# Patient Record
Sex: Female | Born: 1960 | Race: White | Hispanic: No | Marital: Single | Smoking: Current some day smoker
Health system: Southern US, Community
[De-identification: ages and names within clinical notes are randomized; demographics above are authoritative.]

## PROBLEM LIST (undated history)

## (undated) DIAGNOSIS — Z9189 Other specified personal risk factors, not elsewhere classified: Secondary | ICD-10-CM

## (undated) DIAGNOSIS — IMO0001 Reserved for inherently not codable concepts without codable children: Secondary | ICD-10-CM

## (undated) DIAGNOSIS — G43909 Migraine, unspecified, not intractable, without status migrainosus: Secondary | ICD-10-CM

## (undated) DIAGNOSIS — K219 Gastro-esophageal reflux disease without esophagitis: Secondary | ICD-10-CM

---

## 2001-02-21 HISTORY — PX: BACK SURGERY: SHX140

## 2008-07-24 ENCOUNTER — Ambulatory Visit: Payer: Self-pay | Admitting: Family Medicine

## 2008-07-24 DIAGNOSIS — I10 Essential (primary) hypertension: Secondary | ICD-10-CM

## 2008-07-24 DIAGNOSIS — E782 Mixed hyperlipidemia: Secondary | ICD-10-CM

## 2008-07-24 DIAGNOSIS — F39 Unspecified mood [affective] disorder: Secondary | ICD-10-CM | POA: Insufficient documentation

## 2008-08-19 ENCOUNTER — Telehealth: Payer: Self-pay | Admitting: Family Medicine

## 2008-12-25 ENCOUNTER — Ambulatory Visit: Payer: Self-pay | Admitting: Family Medicine

## 2008-12-25 DIAGNOSIS — R062 Wheezing: Secondary | ICD-10-CM

## 2009-01-22 ENCOUNTER — Ambulatory Visit: Payer: Self-pay | Admitting: Family Medicine

## 2009-01-22 DIAGNOSIS — R21 Rash and other nonspecific skin eruption: Secondary | ICD-10-CM

## 2009-02-12 ENCOUNTER — Ambulatory Visit: Payer: Self-pay | Admitting: Family Medicine

## 2009-02-12 DIAGNOSIS — J42 Unspecified chronic bronchitis: Secondary | ICD-10-CM

## 2009-02-23 ENCOUNTER — Ambulatory Visit: Payer: Self-pay | Admitting: Family Medicine

## 2009-02-24 ENCOUNTER — Telehealth: Payer: Self-pay | Admitting: Family Medicine

## 2009-03-25 ENCOUNTER — Ambulatory Visit: Payer: Self-pay | Admitting: Family Medicine

## 2009-03-25 LAB — CONVERTED CEMR LAB
Inflenza A Ag: NEGATIVE
Influenza B Ag: NEGATIVE

## 2009-04-03 ENCOUNTER — Telehealth: Payer: Self-pay | Admitting: Family Medicine

## 2009-06-23 ENCOUNTER — Encounter: Admission: RE | Admit: 2009-06-23 | Discharge: 2009-06-23 | Payer: Self-pay | Admitting: Family Medicine

## 2009-06-23 ENCOUNTER — Ambulatory Visit: Payer: Self-pay | Admitting: Family Medicine

## 2009-06-23 DIAGNOSIS — M545 Low back pain: Secondary | ICD-10-CM

## 2009-07-06 ENCOUNTER — Ambulatory Visit: Payer: Self-pay | Admitting: Family Medicine

## 2009-09-07 ENCOUNTER — Ambulatory Visit: Payer: Self-pay | Admitting: Family Medicine

## 2009-09-07 DIAGNOSIS — K3189 Other diseases of stomach and duodenum: Secondary | ICD-10-CM | POA: Insufficient documentation

## 2009-09-07 DIAGNOSIS — R1013 Epigastric pain: Secondary | ICD-10-CM

## 2009-09-17 ENCOUNTER — Encounter: Payer: Self-pay | Admitting: Family Medicine

## 2009-09-19 ENCOUNTER — Encounter: Payer: Self-pay | Admitting: Family Medicine

## 2009-09-28 ENCOUNTER — Encounter: Payer: Self-pay | Admitting: Family Medicine

## 2009-10-15 ENCOUNTER — Encounter: Payer: Self-pay | Admitting: Family Medicine

## 2009-10-16 ENCOUNTER — Encounter: Payer: Self-pay | Admitting: Family Medicine

## 2009-10-21 ENCOUNTER — Encounter: Payer: Self-pay | Admitting: Family Medicine

## 2009-10-22 LAB — CONVERTED CEMR LAB
Cholesterol: 250 mg/dL — ABNORMAL HIGH (ref 0–200)
HDL: 35 mg/dL — ABNORMAL LOW (ref >39)
Total CHOL/HDL Ratio: 7.1
VLDL: 63 mg/dL — ABNORMAL HIGH (ref 0–40)

## 2009-10-23 ENCOUNTER — Telehealth: Payer: Self-pay | Admitting: Family Medicine

## 2009-10-31 ENCOUNTER — Ambulatory Visit: Payer: Self-pay | Admitting: Emergency Medicine

## 2009-10-31 LAB — CONVERTED CEMR LAB
Bilirubin Urine: NEGATIVE
Ketones, urine, test strip: NEGATIVE

## 2009-11-01 ENCOUNTER — Encounter: Payer: Self-pay | Admitting: Family Medicine

## 2009-11-06 ENCOUNTER — Ambulatory Visit: Payer: Self-pay | Admitting: Family Medicine

## 2009-11-06 DIAGNOSIS — N760 Acute vaginitis: Secondary | ICD-10-CM | POA: Insufficient documentation

## 2009-11-06 LAB — CONVERTED CEMR LAB
Glucose, Urine, Semiquant: NEGATIVE
Nitrite: NEGATIVE
Urobilinogen, UA: 0.2

## 2009-11-10 ENCOUNTER — Encounter: Payer: Self-pay | Admitting: Family Medicine

## 2009-11-11 LAB — CONVERTED CEMR LAB: Yeast Wet Prep HPF POC: NONE SEEN

## 2009-11-26 ENCOUNTER — Telehealth: Payer: Self-pay | Admitting: Family Medicine

## 2010-01-19 ENCOUNTER — Encounter: Payer: Self-pay | Admitting: Family Medicine

## 2010-03-23 NOTE — Consult Note (Signed)
Summary: [facility]  [facility]   Imported By: Lanelle Bal 09/25/2009 11:53:16  _____________________________________________________________________  External Attachment:    Type:   Image     Comment:   External Document

## 2010-03-23 NOTE — Letter (Signed)
Summary: Letter to Patient with [place] Results/[facility]  Letter to Patient with [place] Results/[facility]   Imported By: Lanelle Bal 10/06/2009 11:49:44  _____________________________________________________________________  External Attachment:    Type:   Image     Comment:   External Document

## 2010-03-23 NOTE — Progress Notes (Signed)
Summary: Wants Chantix  Phone Note Call from Patient Call back at Home Phone [phone]   Caller: Patient Call For: Nani Gasser MD Summary of Call: Pt would like to start the Chantix to stop smoking. Please send to [facility] Initial call taken by: Kathlene November,  February 24, 2009 8:14 AM  Follow-up for Phone Call        Rx sent. Call if any mood change. Call in one month if doing well and will call  in the continuing pack.  Follow-up by: Nani Gasser MD,  February 24, 2009 8:34 AM    New/Updated Medications: CHANTIX STARTING MONTH PAK 0.5 MG X 11 & 1 MG X 42 TABS (VARENICLINE TARTRATE) Take as directed. Prescriptions: CHANTIX STARTING MONTH PAK 0.5 MG X 11 & 1 MG X 42 TABS (VARENICLINE TARTRATE) Take as directed.  #1 pack x 0   Entered and Authorized by:   Nani Gasser MD   Signed by:   Nani Gasser MD on 02/24/2009   Method used:   Electronically to        [facility] (retail)       [address]       [place], [place]  [address]       Ph: [phone]       Fax: [phone]   RxID:   [id number]

## 2010-03-23 NOTE — Progress Notes (Signed)
Summary: Antibiotics refill  Phone Note Call from Patient   Caller: Patient Summary of Call: Patient called and left a voice mail wanting  to know if her Dr. could refill her antibiotics for her? She asked that someone call her back at phone number [phone]... Thanks, Victorino Dike Initial call taken by: Michaelle Copas,  November 26, 2009 9:11 AM  Follow-up for Phone Call        called pt at number listed and left message stating that we dont refill abx without eval you in the office first and to call back with questions or to make an appt Follow-up by: Avon Gully CMA, Duncan Dull),  November 26, 2009 9:51 AM

## 2010-03-23 NOTE — Letter (Signed)
Summary: Out of Work  [facility]  [address], [address]   [place], [place] [address]   Phone: [phone]  Fax: [phone]    Jun 23, 2009   Employee:  Vickie Carter    To Whom It May Concern:   For Medical reasons, please excuse the above named employee from work for the following dates:  Can return to work on 06/25/2009 with no lifting, pushing or pulling greater than 10 pounds. 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 She will follow up for re-evaluation at that point.   If you need additional information, please feel free to contact our office.         Sincerely,    Nani Gasser MD

## 2010-03-23 NOTE — Letter (Signed)
Summary: [facility]  [facility]   Imported By: Maryln Gottron 01/28/2010 15:46:12  _____________________________________________________________________  External Attachment:    Type:   Image     Comment:   External Document

## 2010-03-23 NOTE — Letter (Signed)
Summary: Letter to Patient with Lab Results/[facility]   Letter to Patient with Lab Results/[facility]   Imported By: Lanelle Bal 09/28/2009 13:50:40  _____________________________________________________________________  External Attachment:    Type:   Image     Comment:   External Document

## 2010-03-23 NOTE — Letter (Signed)
Summary: Letter with Colonoscopy Results/[facility]  Letter with Colonoscopy Results/[facility]   Imported By: Lanelle Bal 10/28/2009 10:39:33  _____________________________________________________________________  External Attachment:    Type:   Image     Comment:   External Document

## 2010-03-23 NOTE — Assessment & Plan Note (Signed)
Summary: Burning, painful urination x last night rm 4   Vital Signs:  Patient Profile:   50 Years Old Female CC:      Burning, painful urination x 1 dy Height:     65 inches Weight:      194 pounds O2 Sat:      100 % O2 treatment:    Room Air Temp:     98.0 degrees F oral Pulse rate:   67 / minute Pulse rhythm:   regular Resp:     16 per minute BP sitting:   122 / 80  (left arm) Cuff size:   regular  Vitals Entered By: Areta Haber CMA (October 31, 2009 11:47 AM)                  Current Allergies: No known allergies History of Present Illness History from: patient Chief Complaint: Burning, painful urination x 1 dy History of Present Illness: This 50 Years Old White Female comes in today complaining of urinary symptoms starting yesterday.  +dysuria, frequency, urgency.  No F/C/N/V, hematuria, vag d/c.  Azo helps. Worse with urination.  Current Problems: URINARY TRACT INFECTION (ICD-599.0) DYSPEPSIA (ICD-536.8) BACK PAIN, LUMBAR (ICD-724.2) ACUTE BRONCHITIS (ICD-466.0) SKIN RASH (ICD-782.1) WHEEZING (ICD-786.07) HYPERLIPIDEMIA (ICD-272.4) HYPERTENSION, BENIGN (ICD-401.1) MOOD DISORDER (ICD-296.90)   Current Meds ALPRAZOLAM 0.5 MG TABS (ALPRAZOLAM) Take one tablet by mouth three times a day as needed anxiety PROPRANOLOL HCL 40 MG TABS (PROPRANOLOL HCL) Take 1 tablet by mouth two times a day POLYETHYLENE GLYCOL 3350  POWD (POLYETHYLENE GLYCOL 3350) 1 capful mixed with a glass of water once a day PROZAC 20 MG CAPS (FLUOXETINE HCL) Take 1 tablet by mouth once a day VENTOLIN HFA 108 (90 BASE) MCG/ACT AERS (ALBUTEROL SULFATE) 2- 4 puff inhaled two times a day as needed SYMBICORT 160-4.5 MCG/ACT AERO (BUDESONIDE-FORMOTEROL FUMARATE) 2 puffs inhaled two times a day TRAZODONE HCL 50 MG TABS (TRAZODONE HCL) take 1-2 tablets by mouth at bedtime for sleep OMEPRAZOLE 40 MG CPDR (OMEPRAZOLE) 1 tab by mouth qAM, take 30 min before breakfast CYCLOBENZAPRINE HCL 10 MG TABS  (CYCLOBENZAPRINE HCL) 1 tab by mouth hs AZO-STANDARD 95 MG TABS (PHENAZOPYRIDINE HCL) as directed BACTRIM DS 800-160 MG TABS (SULFAMETHOXAZOLE-TRIMETHOPRIM) 1 tab by mouth two times a day for 5 days  REVIEW OF SYSTEMS Constitutional Symptoms      Denies fever, chills, night sweats, weight loss, weight gain, and fatigue.  Eyes       Denies change in vision, eye pain, eye discharge, glasses, contact lenses, and eye surgery. Ear/Nose/Throat/Mouth       Denies hearing loss/aids, change in hearing, ear pain, ear discharge, dizziness, frequent runny nose, frequent nose bleeds, sinus problems, sore throat, hoarseness, and tooth pain or bleeding.  Respiratory       Denies dry cough, productive cough, wheezing, shortness of breath, asthma, bronchitis, and emphysema/COPD.  Cardiovascular       Denies murmurs, chest pain, and tires easily with exhertion.    Gastrointestinal       Denies stomach pain, nausea/vomiting, diarrhea, constipation, blood in bowel movements, and indigestion. Genitourniary       Complains of painful urination.      Denies kidney stones and loss of urinary control.      Comments: Burning x last night Neurological       Denies paralysis, seizures, and fainting/blackouts. Musculoskeletal       Denies muscle pain, joint pain, joint stiffness, decreased range of motion, redness, swelling, muscle weakness, and  gout.  Skin       Denies bruising, unusual mles/lumps or sores, and hair/skin or nail changes.  Psych       Denies mood changes, temper/anger issues, anxiety/stress, speech problems, depression, and sleep problems.  Past History:  Past Medical History: Last updated: 12/25/2008 Seing Dr.Pugh.    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   Current Smoker Alcohol use-no Drug  use-no Regular exercise-no  Risk Factors: Exercise: no (07/24/2008)  Risk Factors: Smoking Status: current (07/24/2008) Physical Exam General appearance: well developed, well nourished, no acute distress Chest/Lungs: no rales, wheezes, or rhonchi bilateral, breath sounds equal without effort Heart: regular rate and  rhythm, no murmur Back: no CVAT Skin: no obvious rashes or lesions MSE: oriented to time, place, and person Assessment New Problems: URINARY TRACT INFECTION (ICD-599.0)   Plan New Medications/Changes: BACTRIM DS 800-160 MG TABS (SULFAMETHOXAZOLE-TRIMETHOPRIM) 1 tab by mouth two times a day for 5 days  #10 x 0, 10/31/2009, Hoyt Koch MD  New Orders: New Patient Level II [phone] UA Dipstick w/o Micro (automated)  [81003] T-Culture, Urine [[id number]] Planning Comments:   Hydration May continue the AZO for another 1-2 days Wipe front to back If recurrent symptoms or worsening problems, please Follow-up with your primary care physician or a urologist   The patient and/or caregiver has been counseled thoroughly with regard to medications prescribed including dosage, schedule, interactions, rationale for use, and possible side effects and they verbalize understanding.  Diagnoses and expected course of recovery discussed and will return if not improved as expected or if the condition worsens. Patient and/or caregiver verbalized understanding.  Prescriptions: BACTRIM DS 800-160 MG TABS (SULFAMETHOXAZOLE-TRIMETHOPRIM) 1 tab by mouth two times a day for 5 days  #10 x 0   Entered and Authorized by:   Hoyt Koch MD   Signed by:   Hoyt Koch MD on 10/31/2009   Method used:   Printed then faxed to ...       [facility]* (retail)       [address]       [place], [place]  [address]       Ph: [phone]       Fax: [phone]   RxID:   [id number]   Orders Added: 1)  New Patient Level II [99202] 2)  UA Dipstick w/o Micro (automated)  [81003] 3)   T-Culture, Urine [[id number]]  Laboratory Results   Urine Tests  Date/Time Received: October 31, 2009 12:00 PM  Date/Time Reported: October 31, 2009 12:00 PM   Routine Urinalysis   Color: orange Appearance: Clear Glucose: 100   (Normal Range: Negative) Bilirubin: negative   (Normal Range: Negative) Ketone: negative   (Normal Range: Negative) Spec. Gravity: 1.020   (Normal Range: 1.003-1.035) Blood: trace-intact   (Normal Range: Negative) pH: 5.0   (Normal Range: 5.0-8.0) Protein: trace   (Normal Range: Negative) Urobilinogen: 1.0   (Normal Range: 0-1) Nitrite: positive   (Normal Range: Negative) Leukocyte Esterace: negative   (Normal Range: Negative)

## 2010-03-23 NOTE — Assessment & Plan Note (Signed)
Summary: LBP f/u/ hiatal hernia   Vital Signs:  Patient profile:   50 year old female Height:      65 inches Weight:      197 pounds BMI:     32.90 O2 Sat:      96 % on Room air Pulse rate:   75 / minute BP sitting:   125 / 77  (left arm) Cuff size:   large  Vitals Entered By: Vickie Carter CMA (September 07, 2009 3:46 PM)  O2 Flow:  Room air CC: F/U back pain. Doing better.   Primary Carter Provider:  Nani Gasser, MD  CC:  F/U back pain. Doing better.Marland Kitchen  History of Present Illness: 50 yo WF presents for f/u back pain.  Her pain is down to a 3/10 which is her baseline before it started getting bad.  She is no longer taking any meds for pain.  She has been seeing Vickie Carter for manipulation  which has helped.    She has been having a problem with dyspepsia -- better on on Prevacid.  Her last EGD was 15 yrs ago.  She has a large HH. Has never had a colonoscopy.      Current Medications (verified): 1)  Alprazolam 0.5 Mg Tabs (Alprazolam) .... Take One Tablet By Mouth Three Times A Day As Needed Anxiety 2)  Propranolol Hcl 40 Mg Tabs (Propranolol Hcl) .... Pt States She Takes Only As Needed 3)  Polyethylene Glycol 3350  Powd (Polyethylene Glycol 3350) .Marland Kitchen.. 1 Capful Mixed With A Glass of Water Once A Day 4)  Prozac 20 Mg Caps (Fluoxetine Hcl) .... Take 1 Tablet By Mouth Once A Day 5)  Ventolin Hfa 108 (90 Base) Mcg/act Aers (Albuterol Sulfate) .... 2- 4 Puff Inhaled Two Times A Day As Needed 6)  Symbicort 160-4.5 Mcg/act Aero (Budesonide-Formoterol Fumarate) .... 2 Puffs Inhaled Two Times A Day 7)  Trazodone Hcl 50 Mg Tabs (Trazodone Hcl) .... Take 1-2 Tablets By Mouth At Bedtime For Sleep  Allergies (verified): No Known Drug Allergies  Past History:  Past Medical History: Reviewed history from 12/25/2008 and no changes required. Seing Vickie Carter.    Past Surgical History: Reviewed history from 06/23/2009 and no changes required. Back surgery 2003  Family History: Reviewed  history from 07/24/2008 and no changes required. Brother with alcoholism,  Sister with depression  Social History: Reviewed history from 07/24/2008 and no changes required. CNA-1. HS degree.  Single.   Current Smoker Alcohol use-no Drug use-no Regular exercise-no  Review of Systems      See HPI  Physical Exam  General:  alert, well-developed, well-nourished, and well-hydrated.  obese Head:  normocephalic and atraumatic.   Neck:  no masses.   Lungs:  normal respiratory effort, no intercostal retractions, and no accessory muscle use.   Heart:  Normal rate and regular rhythm. S1 and S2 normal without gallop, murmur, click, rub or other extra sounds. Abdomen:  slight epigastric TTP but no R/G/R, soft, ND.   Msk:  full active L spine ROM w/o pain Neurologic:  gait normal.   Skin:  color normal.     Impression & Recommendations:  Problem # 1:  BACK PAIN, LUMBAR (ICD-724.2) Assessment Improved Improved after chiropractic treatment.  Off meds.  Needs to work on regular stretching and exercise for long term maintenance of chronic back pain. The following medications were removed from the medication list:    Cyclobenzaprine Hcl 10 Mg Tabs (Cyclobenzaprine hcl) .Marland Kitchen... Take 1 tablet by mouth  three times a day as needed for muscle spasm.  Problem # 2:  DYSPEPSIA (ICD-536.8) Changed her OTC Prevacid to Omeprazole 40 mg/ day.  H/O on reflux precautions given to pt.  Refer to GI for EGD/ colonoscopy given age and hx of large HH. Orders: Gastroenterology Referral (GI)  Complete Medication List: 1)  Alprazolam 0.5 Mg Tabs (Alprazolam) .... Take one tablet by mouth three times a day as needed anxiety 2)  Propranolol Hcl 40 Mg Tabs (Propranolol hcl) .... Pt states she takes only as needed 3)  Polyethylene Glycol 3350 Powd (Polyethylene glycol 3350) .Marland Kitchen.. 1 capful mixed with a glass of water once a day 4)  Prozac 20 Mg Caps (Fluoxetine hcl) .... Take 1 tablet by mouth once a day 5)  Ventolin  Hfa 108 (90 Base) Mcg/act Aers (Albuterol sulfate) .... 2- 4 puff inhaled two times a day as needed 6)  Symbicort 160-4.5 Mcg/act Aero (Budesonide-formoterol fumarate) .... 2 puffs inhaled two times a day 7)  Trazodone Hcl 50 Mg Tabs (Trazodone hcl) .... Take 1-2 tablets by mouth at bedtime for sleep 8)  Omeprazole 40 Mg Cpdr (Omeprazole) .Marland Kitchen.. 1 tab by mouth qam, take 30 min before breakfast  Patient Instructions: 1)  GI referral made. 2)  Switch Prevacid to Omeprazole.  Take each AM, before breakfast. 3)  Adhere to reflux precautions. 4)  Continue home PT for back pain. Prescriptions: OMEPRAZOLE 40 MG CPDR (OMEPRAZOLE) 1 tab by mouth qAM, take 30 min before breakfast  #30 x 2   Entered and Authorized by:   Vickie Bars DO   Signed by:   Vickie Bars DO on 09/07/2009   Method used:   Electronically to        [facility] (retail)       [address]       [place], [place]  [address]       Ph: [phone]       Fax: [phone]   RxID:   [id number]

## 2010-03-23 NOTE — Progress Notes (Signed)
Summary: meds  Phone Note Call from Patient   Caller: Patient Call For: Nani Gasser MD Summary of Call: Pt called and states she cant take any chol meds and has been on several different ones  in the past and they made her feel bad. Pt does not want the medication. Initial call taken by: Avon Gully CMA, Duncan Dull),  October 23, 2009 9:08 AM  Follow-up for Phone Call        Would she like to see a cholessterol specialist?  Follow-up by: Nani Gasser MD,  October 23, 2009 9:48 AM  Additional Follow-up for Phone Call Additional follow up Details #1::        LM on voicemail asking if wanted to see the above and to call us back if she wishes to do this. KJ LPN Additional Follow-up by: Kathlene November,  October 23, 2009 11:40 AM

## 2010-03-23 NOTE — Progress Notes (Signed)
Summary: needs meds called in  Phone Note Call from Vickie Carter   Caller: Vickie Carter Summary of Call: Dr.Metheney     Call Back   [phone]   Vickie Carter was seen about 7 days ago, she said that Dr.Metheney told her to call back to get some meds called in.      [facility] Initial call taken by: Vanessa [place],  April 03, 2009 8:43 AM  Follow-up for Phone Call        Here is Dr. Ms note from last OV.  "REstart the symbicor two times a day and then use her albuterol as needed.  She is iin teh yellow today on her peakf flows and has some tightness in her chest.  Likely viral. Flu is neg. If not better in one week then pleaes let me know or if breathing gets worse. " Follow-up by: Payton Spark CMA,  April 03, 2009 8:55 AM    New/Updated Medications: DOXYCYCLINE HYCLATE 100 MG TABS (DOXYCYCLINE HYCLATE) 1 tab by mouth two times a day x 10 days PREDNISONE 20 MG TABS (PREDNISONE) 3 tabs by mouth once daily x 5 days Prescriptions: PREDNISONE 20 MG TABS (PREDNISONE) 3 tabs by mouth once daily x 5 days  #15 x 0   Entered and Authorized by:   Seymour Bars DO   Signed by:   Seymour Bars DO on 04/03/2009   Method used:   Electronically to        [facility]* (retail)       [address]       [place], [place]  [address]       Ph: [phone]       Fax: [phone]   RxID:   [id number] DOXYCYCLINE HYCLATE 100 MG TABS (DOXYCYCLINE HYCLATE) 1 tab by mouth two times a day x 10 days  #20 x 0   Entered and Authorized by:   Seymour Bars DO   Signed by:   Seymour Bars DO on 04/03/2009   Method used:   Electronically to        [facility] (retail)       [address]       [place], [place]  [address]       Ph: [phone]       Fax: [phone]   RxID:   [id number]   Appended Document: needs meds called in pt aware

## 2010-03-23 NOTE — Assessment & Plan Note (Signed)
Summary: Acute Bronchitis   Vital Signs:  Patient profile:   50 year old female Height:      65 inches Weight:      206 pounds Temp:     98.8 degrees F oral Pulse rate:   80 / minute BP sitting:   111 / 58  (left arm) Cuff size:   large  Vitals Entered By: Kathlene November (March 25, 2009 11:05 AM)  Serial Vital Signs/Assessments:                                PEF    PreRx  PostRx Time      O2 Sat  O2 Type     L/min  L/min  L/min   By 11:22 AM                      290    290    290     Kim Johnson  Comments: 11:22 AM pt in  yellow zone By: Kathlene November   CC: chest and nasal congestion, bodyaches, tired, sore throat, H/A , cough for a few days now   Primary Care Provider:  Nani Gasser, MD  CC:  chest and nasal congestion, bodyaches, tired, sore throat, H/A , and cough for a few days now.  History of Present Illness: chest and nasal congestion, bodyaches, tired, sore throat, H/A , cough for a few days now. Started yesterday.  hard to get out of bed. Low grade fever.  + sick contacts, brother who is living with her. No OTC meds.  Started with ear pain bilat. + chills.   Current Medications (verified): 1)  Alprazolam 0.5 Mg Tabs (Alprazolam) .... Take One Tablet By Mouth Three Times A Day As Needed Anxiety 2)  Propranolol Hcl 40 Mg Tabs (Propranolol Hcl) .... Pt States She Takes Only As Needed 3)  Polyethylene Glycol 3350  Powd (Polyethylene Glycol 3350) .Marland Kitchen.. 1 Capful Mixed With A Glass of Water Once A Day 4)  Prozac 20 Mg Caps (Fluoxetine Hcl) .... Take 1 Tablet By Mouth Once A Day 5)  Ventolin Hfa 108 (90 Base) Mcg/act Aers (Albuterol Sulfate) .... 2- 4 Puff Inhaled Two Times A Day As Needed 6)  Symbicort 160-4.5 Mcg/act Aero (Budesonide-Formoterol Fumarate) .... 2 Puffs Inhaled Two Times A Day  Allergies (verified): No Known Drug Allergies  Comments:  Nurse/Medical Assistant: The patient's medications and allergies were reviewed with the patient and were updated  in the Medication and Allergy Lists. Kathlene November (March 25, 2009 11:06 AM)  Physical Exam  General:  Well-developed,well-nourished,in no acute distress; alert,appropriate and cooperative throughout examination Head:  Normocephalic and atraumatic without obvious abnormalities. No apparent alopecia or balding. Eyes:  No corneal or conjunctival inflammation noted. EOMI. Perrla.  Ears:  External ear exam shows no significant lesions or deformities.  Otoscopic examination reveals clear canals, tympanic membranes are intact bilaterally without bulging, retraction, inflammation or discharge. Hearing is grossly normal bilaterally. Nose:  External nasal examination shows no deformity or inflammation. Nasal mucosa are pink and moist without lesions or exudates. Mouth:  Oral mucosa and oropharynx without lesions or exudates.  Teeth in good repair. Neck:  No deformities, masses, or tenderness noted. Lungs:  Wheeze and rhonchi bilat.  Heart:  Normal rate and regular rhythm. S1 and S2 normal without gallop, murmur, click, rub or other extra sounds. Pulses:  Radial 2+ bilat.  Skin:  no rashes.   Cervical Nodes:  No lymphadenopathy noted Psych:  Cognition and judgment appear intact. Alert and cooperative with normal attention span and concentration. No apparent delusions, illusions, hallucinations   Impression & Recommendations:  Problem # 1:  ACUTE BRONCHITIS (ICD-466.0) REstart the symbicor two times a day and then use her albuterol as needed.  She is iin teh yellow today on her peakf flows and has some tightness in her chest.  Likely viral. Flu is neg. If not better in one week then pleaes let me know or if breathing gets worse.  Her updated medication list for this problem includes:    Ventolin Hfa 108 (90 Base) Mcg/act Aers (Albuterol sulfate) .Marland Kitchen... 2- 4 puff inhaled two times a day as needed    Symbicort 160-4.5 Mcg/act Aero (Budesonide-formoterol fumarate) .Marland Kitchen... 2 puffs inhaled two times a  day  Orders: Flu A+B ([address])  Complete Medication List: 1)  Alprazolam 0.5 Mg Tabs (Alprazolam) .... Take one tablet by mouth three times a day as needed anxiety 2)  Propranolol Hcl 40 Mg Tabs (Propranolol hcl) .... Pt states she takes only as needed 3)  Polyethylene Glycol 3350 Powd (Polyethylene glycol 3350) .Marland Kitchen.. 1 capful mixed with a glass of water once a day 4)  Prozac 20 Mg Caps (Fluoxetine hcl) .... Take 1 tablet by mouth once a day 5)  Ventolin Hfa 108 (90 Base) Mcg/act Aers (Albuterol sulfate) .... 2- 4 puff inhaled two times a day as needed 6)  Symbicort 160-4.5 Mcg/act Aero (Budesonide-formoterol fumarate) .... 2 puffs inhaled two times a day  Laboratory Results  Date/Time Received: 03/25/2009 Date/Time Reported: 03/25/2009  Other Tests  Influenza A: negative Influenza B: negative    Appended Document: Acute Bronchitis Reminded pt still needs to schedulespirometry when better.

## 2010-03-23 NOTE — Assessment & Plan Note (Signed)
Summary: Fu bronchitis   Vital Signs:  Patient profile:   50 year old female Height:      65 inches Weight:      206 pounds O2 Sat:      98 % on Room air Pulse rate:   71 / minute BP sitting:   102 / 63  (left arm) Cuff size:   large  Vitals Entered By: Kathlene November (February 23, 2009 4:03 PM)  O2 Flow:  Room air  Serial Vital Signs/Assessments:                                PEF    PreRx  PostRx Time      O2 Sat  O2 Type     L/min  L/min  L/min   By 4:05 PM   98  %   Room air    260    290    300     Kathlene November  Comments: 4:05 PM pt in green zone By: Kathlene November   CC: recheck breathing- pt states doing better   Primary Care Provider:  Nani Gasser, MD  CC:  recheck breathing- pt states doing better.  History of Present Illness: Seen on 12/23 for acute bronchtis by Dr. Cathey Endow.  Start on Zpack, given steroid injection and started on inhalers.  Recheck breathing- pt states doing better. Finished her ABX and stilll on the symbicort.  Fels 80 % better.  still smoking. Quit for about 3 days.   Allergies: No Known Drug Allergies  Physical Exam  General:  Well-developed,well-nourished,in no acute distress; alert,appropriate and cooperative throughout examination Lungs:  Normal respiratory effort, chest expands symmetrically. Lungs are clear to auscultation, no crackles or wheezes. Heart:  Normal rate and regular rhythm. S1 and S2 normal without gallop, murmur, click, rub or other extra sounds. Skin:  no rashes.   Psych:  Cognition and judgment appear intact. Alert and cooperative with normal attention span and concentration. No apparent delusions, illusions, hallucinations   Impression & Recommendations:  Problem # 1:  ACUTE BRONCHITIS (ICD-466.0) 80% better. Still smoking.  Encouraged her to really work on this. Explained that we really need to figure out if has asthma or COPD. Likley COPD with her smoking history but will schedule for spirometry when better. Also  will wean her off the symbicort over he next 3 weeks. She has not had to use her ventolin at all in the last 3 days.  The following medications were removed from the medication list:    Zithromax Z-pak 250 Mg Tabs (Azithromycin) .Marland Kitchen... 2 tabs by mouth x 1 day then 1 tb by mouth daily x 4 days Her updated medication list for this problem includes:    Ventolin Hfa 108 (90 Base) Mcg/act Aers (Albuterol sulfate) .Marland Kitchen... 2- 4 puff inhaled two times a day as needed    Symbicort 160-4.5 Mcg/act Aero (Budesonide-formoterol fumarate) .Marland Kitchen... 2 puffs inhaled two times a day  Complete Medication List: 1)  Alprazolam 0.5 Mg Tabs (Alprazolam) .... Take one tablet by mouth three times a day as needed anxiety 2)  Propranolol Hcl 40 Mg Tabs (Propranolol hcl) .... Pt states she takes only as needed 3)  Polyethylene Glycol 3350 Powd (Polyethylene glycol 3350) .Marland Kitchen.. 1 capful mixed with a glass of water once a day 4)  Prozac 20 Mg Caps (Fluoxetine hcl) .... Take 1 tablet by mouth once a day 5)  Ventolin  Hfa 108 (90 Base) Mcg/act Aers (Albuterol sulfate) .... 2- 4 puff inhaled two times a day as needed 6)  Symbicort 160-4.5 Mcg/act Aero (Budesonide-formoterol fumarate) .... 2 puffs inhaled two times a day  Patient Instructions: 1)  Continue your symbicort for one more week, then decrease to 1 puff twice a day for about 1 week, then drop to 1 puff once a day for 1 week and then stop.   2)  After finish the symbicort, when better, call to schedule spirometry for your breathing.   3)  Work on decreasing cigarette use.

## 2010-03-23 NOTE — Progress Notes (Signed)
Summary: refill  Phone Note Call from Patient Call back at [phone]   Caller: Patient Call For: Vickie Gasser MD Summary of Call: pt called and wants a refill on the antibiotics states she is still itching and burning Initial call taken by: Kathlene November LPN,  November 26, 2009 8:26 AM  Follow-up for Phone Call        can see nurse for self wet prep to look 4 yeast.. BV which is what she had,  doesn't cause itching or burning.  Follow-up by: Vickie Gasser MD,  November 26, 2009 10:12 AM  Additional Follow-up for Phone Call Additional follow up Details #1::        Pt notified to set up nurse visit for wet prep. Additional Follow-up by: Kathlene November LPN,  November 26, 2009 10:20 AM

## 2010-03-23 NOTE — Letter (Signed)
Summary: Out of Work  [facility]  [address]   [place], [place] [address]   Phone: [phone]  Fax: [phone]    March 25, 2009   Employee:  STINA GANE    To Whom It May Concern:   For Medical reasons, please excuse the above named employee from work for the following dates:  Start:   03-25-2009  End:   03-30-2009  If you need additional information, please feel free to contact our office.         Sincerely,    Nani Gasser MD
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
## 2010-03-23 NOTE — Procedures (Signed)
Summary: Colon EGD   Flex Sig Next Due:  Not Indicated Colonoscopy Result Date:  10/15/2009 Colonoscopy Result:  normal Hemoccult Next Due:  Not Indicated
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
## 2010-03-23 NOTE — Assessment & Plan Note (Signed)
Summary: Vaginitis, lipids   Vital Signs:  Patient profile:   50 year old female Height:      65 inches Weight:      197 pounds Pulse rate:   64 / minute BP sitting:   105 / 69  (left arm) Cuff size:   regular  Vitals Entered By: Kathlene November (November 06, 2009 4:11 PM) CC: followup kidney infection Flu Vaccine Consent Questions     Do you have a history of severe allergic reactions to this vaccine? no    Any prior history of allergic reactions to egg and/or gelatin? no    Do you have a sensitivity to the preservative Thimersol? no    Do you have a past history of Guillan-Barre Syndrome? no    Do you currently have an acute febrile illness? no    Have you ever had a severe reaction to latex? no    Vaccine information given and explained to patient? yes    Are you currently pregnant? no    Lot Number:[id number]   Exp Date:08/21/2010   Site Given  Left Deltoid IM   Primary Care Tiondra Fang:  Nani Gasser, MD  CC:  followup kidney infection.  History of Present Illness: Having ithcing  in her vaginal area, feet and ears.  Tender areas on the vagina and her feet.  No dysuria.  Some urinary frequency. No fever.  Went to UC and tx but urine cx is negative. Completed the ABX.  No sinus sxs.    Current Medications (verified): 1)  Alprazolam 0.5 Mg Tabs (Alprazolam) .... Take One Tablet By Mouth Three Times A Day As Needed Anxiety 2)  Propranolol Hcl 40 Mg Tabs (Propranolol Hcl) .... Take 1 Tablet By Mouth Two Times A Day 3)  Polyethylene Glycol 3350  Powd (Polyethylene Glycol 3350) .Marland Kitchen.. 1 Capful Mixed With A Glass of Water Once A Day 4)  Prozac 20 Mg Caps (Fluoxetine Hcl) .... Take 1 Tablet By Mouth Once A Day 5)  Ventolin Hfa 108 (90 Base) Mcg/act Aers (Albuterol Sulfate) .... 2- 4 Puff Inhaled Two Times A Day As Needed 6)  Symbicort 160-4.5 Mcg/act Aero (Budesonide-Formoterol Fumarate) .... 2 Puffs Inhaled Two Times A Day 7)  Trazodone Hcl 50 Mg Tabs (Trazodone Hcl) .... Take  1-2 Tablets By Mouth At Bedtime For Sleep 8)  Omeprazole 40 Mg Cpdr (Omeprazole) .Marland Kitchen.. 1 Tab By Mouth Qam, Take 30 Min Before Breakfast 9)  Cyclobenzaprine Hcl 10 Mg Tabs (Cyclobenzaprine Hcl) .Marland Kitchen.. 1 Tab By Mouth Hs  Allergies (verified): 1)  ! Crestor (Rosuvastatin Calcium) 2)  ! Lipitor (Atorvastatin Calcium) 3)  ! Simvastatin (Simvastatin)  Comments:  Nurse/Medical Assistant: The patient's medications and allergies were reviewed with the patient and were updated in the Medication and Allergy Lists. Kathlene November (November 06, 2009 4:16 PM)  Physical Exam  General:  Well-developed,well-nourished,in no acute distress; alert,appropriate and cooperative throughout examination Head:  Normocephalic and atraumatic without obvious abnormalities. No apparent alopecia or balding. Ears:  External ear exam shows no significant lesions or deformities.  Otoscopic examination reveals clear canals, tympanic membranes are intact bilaterally without bulging, retraction, inflammation or discharge. Hearing is grossly normal bilaterally. Nose:  no external deformity.  no external deformity.   Mouth:  Oral mucosa and oropharynx without lesions or exudates.  Teeth in good repair. Neck:  No deformities, masses, or tenderness noted. Skin:  Skin peeling on the bottoms of her feet.    Impression & Recommendations:  Problem # 1:  VAGINITIS (  ICD-616.10)  Based on her sxs it is more consitant with yeat infection. WEt prep collected.  Also UA does look + so will send for culture but last cx was negative.  The following medications were removed from the medication list:    Bactrim Ds 800-160 Mg Tabs (Sulfamethoxazole-trimethoprim) .Marland Kitchen... 1 tab by mouth two times a day for 5 days  Orders: T-Wet Prep by [facility] [phone])  Problem # 2:  HYPERLIPIDEMIA (ICD-272.4) Reg exercise and low fat diet since she doesn't want to tray anothe statin.Has already tried 3 and gets nauseated with them. Given H.O  today on good fats vs bad fats. Rehcekc in 6 months.   Complete Medication List: 1)  Alprazolam 0.5 Mg Tabs (Alprazolam) .... Take one tablet by mouth three times a day as needed anxiety 2)  Propranolol Hcl 40 Mg Tabs (Propranolol hcl) .... Take 1 tablet by mouth two times a day 3)  Polyethylene Glycol 3350 Powd (Polyethylene glycol 3350) .Marland Kitchen.. 1 capful mixed with a glass of water once a day 4)  Prozac 20 Mg Caps (Fluoxetine hcl) .... Take 1 tablet by mouth once a day 5)  Ventolin Hfa 108 (90 Base) Mcg/act Aers (Albuterol sulfate) .... 2- 4 puff inhaled two times a day as needed 6)  Symbicort 160-4.5 Mcg/act Aero (Budesonide-formoterol fumarate) .... 2 puffs inhaled two times a day 7)  Trazodone Hcl 50 Mg Tabs (Trazodone hcl) .... Take 1-2 tablets by mouth at bedtime for sleep 8)  Omeprazole 40 Mg Cpdr (Omeprazole) .Marland Kitchen.. 1 tab by mouth qam, take 30 min before breakfast 9)  Cyclobenzaprine Hcl 10 Mg Tabs (Cyclobenzaprine hcl) .Marland Kitchen.. 1 tab by mouth hs  Other Orders: UA Dipstick w/o Micro (automated)  (81003) T-Urine Culture (Spectrum Order) [id number]) Admin 1st Vaccine (30865) Flu Vaccine 69yrs + [phone])  Patient Instructions: 1)  Work on diet and exercise and recheck cholesterol in 6 months.  2)  We will call you with the urine and swab results on Monday or Tuesday.  3)  You were given a flu shot today.   Laboratory Results   Urine Tests  Date/Time Received: 11/06/2009 Date/Time Reported: 11/06/2009  Routine Urinalysis   Color: yellow Appearance: Clear Glucose: negative   (Normal Range: Negative) Bilirubin: negative   (Normal Range: Negative) Ketone: negative   (Normal Range: Negative) Spec. Gravity: >=1.030   (Normal Range: 1.003-1.035) Blood: moderate   (Normal Range: Negative) pH: 5.0   (Normal Range: 5.0-8.0) Protein: negative   (Normal Range: Negative) Urobilinogen: 0.2   (Normal Range: 0-1) Nitrite: negative   (Normal Range: Negative) Leukocyte Esterace: trace   (Normal  Range: Negative)

## 2010-03-30 ENCOUNTER — Telehealth: Payer: Self-pay | Admitting: Family Medicine

## 2010-04-08 NOTE — Progress Notes (Signed)
  Phone Note Refill Request Message from:  Fax from Pharmacy on March 30, 2010 9:24 AM  Refills Requested: Medication #1:  POLYETHYLENE GLYCOL 3350  POWD 1 capful mixed with a glass of water once a day Initial call taken by: Avon Gully CMA, Duncan Dull),  March 30, 2010 9:24 AM    Prescriptions: POLYETHYLENE GLYCOL 3350  POWD (POLYETHYLENE GLYCOL 3350) 1 capful mixed with a glass of water once a day  #527 Each x 3   Entered by:   Avon Gully CMA, (AAMA)   Authorized by:   Nani Gasser MD   Signed by:   Avon Gully CMA, (AAMA) on 03/30/2010   Method used:   Electronically to        [facility] (retail)       [address]       [place], [place]  [address]       Ph: [phone]       Fax: [phone]   RxID:   [id number]

## 2010-04-09 ENCOUNTER — Other Ambulatory Visit: Payer: Self-pay | Admitting: Emergency Medicine

## 2010-04-09 ENCOUNTER — Ambulatory Visit
Admission: RE | Admit: 2010-04-09 | Discharge: 2010-04-09 | Disposition: A | Discharge status: Home / Self Care | Payer: BC Managed Care – PPO | Source: Ambulatory Visit | Attending: Emergency Medicine | Admitting: Emergency Medicine

## 2010-04-09 ENCOUNTER — Ambulatory Visit (INDEPENDENT_AMBULATORY_CARE_PROVIDER_SITE_OTHER): Payer: BC Managed Care – PPO | Admitting: Emergency Medicine

## 2010-04-09 ENCOUNTER — Encounter: Payer: Self-pay | Admitting: Emergency Medicine

## 2010-04-09 ENCOUNTER — Other Ambulatory Visit: Payer: Self-pay | Admitting: *Deleted

## 2010-04-09 DIAGNOSIS — R52 Pain, unspecified: Secondary | ICD-10-CM

## 2010-04-09 DIAGNOSIS — M25519 Pain in unspecified shoulder: Secondary | ICD-10-CM

## 2010-04-14 NOTE — Letter (Signed)
Summary: Internal Correspondence  Internal Correspondence   Imported By: Dannette Barbara 04/09/2010 09:40:42  _____________________________________________________________________  External Attachment:    Type:   Image     Comment:   External Document

## 2010-04-14 NOTE — Assessment & Plan Note (Signed)
Summary: LEFT SHOULDER PAIN rm 4   Vital Signs:  Patient Profile:   50 Years Old Female CC:      LT shoulder pain Height:     65 inches Weight:      193.50 pounds O2 Sat:      97 % O2 treatment:    Room Air Temp:     97.7 degrees F oral Pulse rate:   65 / minute Resp:     18 per minute BP sitting:   109 / 72  (left arm) Cuff size:   regular  Vitals Entered By: Clemens Catholic LPN (April 09, 2010 8:18 AM)                  Updated Prior Medication List: ALPRAZOLAM 0.5 MG TABS (ALPRAZOLAM) Take one tablet by mouth three times a day as needed anxiety PROPRANOLOL HCL 40 MG TABS (PROPRANOLOL HCL) Take 1 tablet by mouth two times a day POLYETHYLENE GLYCOL 3350  POWD (POLYETHYLENE GLYCOL 3350) 1 capful mixed with a glass of water once a day PROZAC 20 MG CAPS (FLUOXETINE HCL) Take 1 tablet by mouth once a day VENTOLIN HFA 108 (90 BASE) MCG/ACT AERS (ALBUTEROL SULFATE) 2- 4 puff inhaled two times a day as needed SYMBICORT 160-4.5 MCG/ACT AERO (BUDESONIDE-FORMOTEROL FUMARATE) 2 puffs inhaled two times a day TRAZODONE HCL 50 MG TABS (TRAZODONE HCL) take 1-2 tablets by mouth at bedtime for sleep OMEPRAZOLE 40 MG CPDR (OMEPRAZOLE) 1 tab by mouth qAM, take 30 min before breakfast CYCLOBENZAPRINE HCL 10 MG TABS (CYCLOBENZAPRINE HCL) 1 tab by mouth hs HYOSCYAMINE SULFATE CR 0.375 MG XR12H-TAB (HYOSCYAMINE SULFATE)   Current Allergies (reviewed today): ! CRESTOR (ROSUVASTATIN CALCIUM) ! LIPITOR (ATORVASTATIN CALCIUM) ! SIMVASTATIN (SIMVASTATIN)History of Present Illness History from: patient Chief Complaint: LT shoulder pain History of Present Illness: 50yo WF presents with L shoulder pain.  She doesn't recall any trauma or injuries.  No known OA.  It has been going on for about 3 weeks and getting worse.  She states that the pain is only at night when she lays on that side.  Not taking any OTC meds.  No abd pain.  She went to the dentist b/c she thought it was referred pain from a tooth,  but he said her teeth were fine.   Absolutely no CP, SOB, diaphoresis.  REVIEW OF SYSTEMS Constitutional Symptoms      Denies fever, chills, night sweats, weight loss, weight gain, and fatigue.  Eyes       Denies change in vision, eye pain, eye discharge, glasses, contact lenses, and eye surgery. Ear/Nose/Throat/Mouth       Denies hearing loss/aids, change in hearing, ear pain, ear discharge, dizziness, frequent runny nose, frequent nose bleeds, sinus problems, sore throat, hoarseness, and tooth pain or bleeding.  Respiratory       Complains of asthma.      Denies dry cough, productive cough, wheezing, shortness of breath, bronchitis, and emphysema/COPD.  Cardiovascular       Complains of tires easily with exhertion.      Denies murmurs and chest pain.    Gastrointestinal       Denies stomach pain, nausea/vomiting, diarrhea, constipation, blood in bowel movements, and indigestion. Genitourniary       Denies painful urination, kidney stones, and loss of urinary control. Neurological       Denies paralysis, seizures, and fainting/blackouts. Musculoskeletal       Complains of muscle pain and joint pain.  Denies joint stiffness, decreased range of motion, redness, swelling, muscle weakness, and gout.  Skin       Denies bruising, unusual mles/lumps or sores, and hair/skin or nail changes.  Psych       Denies mood changes, temper/anger issues, anxiety/stress, speech problems, depression, and sleep problems. Other Comments: pt c/o LT shoulder/upper arm pain goes up into neck x 2-3 wks. no injury. pt statest that it only hurts when she lays on that arm, does not hurt any other time.    Past History:  Past Medical History: Seeing Dr.Pugh.    Past Surgical History: Reviewed history from 06/23/2009 and no changes required. Back surgery 2003  Family History: Reviewed history from 07/24/2008 and no changes required. Brother with alcoholism,  Sister with depression  Social  History: Reviewed history from 07/24/2008 and no changes required. CNA-1. HS degree.  Single.   Current Smoker Alcohol use-no Drug use-no Regular exercise-no Physical Exam General appearance: well developed, well nourished, no acute distress Chest/Lungs: no rales, wheezes, or rhonchi bilateral, breath sounds equal without effort Heart: regular rate and  rhythm, no murmur Neurological: Distal NV status intact Skin: no obvious rashes or lesions MSE: oriented to time, place, and person L Shoulder: Inspection reveals no abnormalities, atrophy or asymmetry.  Palpation is normal with no tenderness over AC, Chandler, bicipital groove, acromion, and coracoid.  ROM is full in all planes. Rotator cuff strength normal throughout. +Neer and Hawkin's tests, empty can.  Speeds and Yergason's tests normal.  Painful O'briens, normal scapular function.  Assessment New Problems: SHOULDER PAIN (ICD-719.41)   Patient Education: Patient and/or caregiver instructed in the following: rest, fluids.  Plan New Medications/Changes: MELOXICAM 7.5 MG TABS (MELOXICAM) 1 by mouth two times a day for 1 week, then as needed afterwards  #35 x 0, 04/09/2010, Hoyt Koch MD  New Orders: Est. Patient Level IV [75102] T-DG Shoulder*L* [73030] Planning Comments:   Xray is normal other than mild AC OA.  Pain likely from RTC strain.  No evidence of any cardiac etiology. Encourage ice and rest and gentle ROM exercises.  Rx for Meloxicam twice a day with food. Will call in 1 week.  If not improving, refer to [facility] or [facility] for PT +/- injection (or I can do it if working that day).  If any cardiac symptoms, go to ER.   The patient and/or caregiver has been counseled thoroughly with regard to medications prescribed including dosage, schedule, interactions, rationale for use, and possible side effects and they verbalize understanding.  Diagnoses and expected course of recovery discussed and will return if not improved as  expected or if the condition worsens. Patient and/or caregiver verbalized understanding.   PROCEDURE: Procedure: Patient tolerated procedure well. Prescriptions: MELOXICAM 7.5 MG TABS (MELOXICAM) 1 by mouth two times a day for 1 week, then as needed afterwards  #35 x 0   Entered and Authorized by:   Hoyt Koch MD   Signed by:   Hoyt Koch MD on 04/09/2010   Method used:   Print then Give to Patient   RxID:   [id number]   Orders Added: 1)  Est. Patient Level IV [43154] 2)  T-DG Shoulder*L* [73030]

## 2010-04-16 ENCOUNTER — Telehealth (INDEPENDENT_AMBULATORY_CARE_PROVIDER_SITE_OTHER): Payer: Self-pay | Admitting: *Deleted

## 2010-04-20 NOTE — Progress Notes (Signed)
  Phone Note Outgoing Call Call back at [facility] Phone [phone]   Call placed by: Lajean Saver RN,  April 16, 2010 3:00 PM Call placed to: Patient Summary of Call: Callback: No answer. Message left with reason for call and call back with questions or concerns

## 2010-04-28 ENCOUNTER — Telehealth: Payer: Self-pay | Admitting: Family Medicine

## 2010-05-04 ENCOUNTER — Ambulatory Visit: Payer: BC Managed Care – PPO | Admitting: Family Medicine

## 2010-05-04 NOTE — Progress Notes (Signed)
  Phone Note Outgoing Call   Summary of Call: Cal pt: she need a 6 month f/u for BP.  Initial call taken by: Nani Gasser MD,  April 28, 2010 8:00 AM  Follow-up for Phone Call        pt notified via vm Follow-up by: Avon Gully CMA, Duncan Dull),  April 28, 2010 11:52 AM

## 2010-05-10 ENCOUNTER — Ambulatory Visit: Payer: BC Managed Care – PPO | Admitting: Family Medicine

## 2010-05-20 ENCOUNTER — Encounter: Payer: Self-pay | Admitting: Family Medicine

## 2010-05-24 ENCOUNTER — Encounter: Payer: Self-pay | Admitting: Family Medicine

## 2010-05-24 ENCOUNTER — Other Ambulatory Visit: Payer: Self-pay | Admitting: Family Medicine

## 2010-05-24 ENCOUNTER — Ambulatory Visit (INDEPENDENT_AMBULATORY_CARE_PROVIDER_SITE_OTHER): Payer: BC Managed Care – PPO | Admitting: Family Medicine

## 2010-05-24 VITALS — BP 102/68 | HR 70 | Ht 65.0 in | Wt 195.0 lb

## 2010-05-24 DIAGNOSIS — R51 Headache: Secondary | ICD-10-CM

## 2010-05-24 DIAGNOSIS — J42 Unspecified chronic bronchitis: Secondary | ICD-10-CM

## 2010-05-24 DIAGNOSIS — R1013 Epigastric pain: Secondary | ICD-10-CM

## 2010-05-24 DIAGNOSIS — E785 Hyperlipidemia, unspecified: Secondary | ICD-10-CM

## 2010-05-24 LAB — LIPID PANEL
Total CHOL/HDL Ratio: 7.5 Ratio
Triglycerides: 479 mg/dL — ABNORMAL HIGH (ref <150)

## 2010-05-24 MED ORDER — ALBUTEROL SULFATE HFA 108 (90 BASE) MCG/ACT IN AERS: 2.0000 | INHALATION_SPRAY | RESPIRATORY_TRACT | Status: DC | PRN

## 2010-05-24 MED ORDER — BUDESONIDE-FORMOTEROL FUMARATE 80-4.5 MCG/ACT IN AERO: 2.0000 | INHALATION_SPRAY | Freq: Two times a day (BID) | RESPIRATORY_TRACT | Status: DC

## 2010-05-24 MED ORDER — PROPRANOLOL HCL 40 MG PO TABS: 40.0000 mg | ORAL_TABLET | Freq: Two times a day (BID) | ORAL | Status: DC

## 2010-05-24 MED ORDER — OMEPRAZOLE 40 MG PO CPDR: 40.0000 mg | DELAYED_RELEASE_CAPSULE | Freq: Every day | ORAL | Status: DC

## 2010-05-24 MED ORDER — MELOXICAM 7.5 MG PO TABS: 7.5000 mg | ORAL_TABLET | Freq: Every day | ORAL | Status: DC

## 2010-05-24 MED ORDER — TRAZODONE HCL 50 MG PO TABS: 50.0000 mg | ORAL_TABLET | Freq: Every day | ORAL | Status: DC

## 2010-05-24 NOTE — Assessment & Plan Note (Signed)
She did have wheezing on exam. Inspiratory wheezing. Restart symbicort at lower dose today. Sample given. Use albuterol prn. Call if not better controlled over the next 2 weeks. She really needs spirometry to see if has COPD or asthma.

## 2010-05-24 NOTE — Assessment & Plan Note (Signed)
Well control. Even though BP a little low today she feels she is asymptomatic so will continue current regimen.

## 2010-05-24 NOTE — Assessment & Plan Note (Signed)
Will recheck levels. She has not tolerated several statins. Consider welchol if needed.

## 2010-05-24 NOTE — Assessment & Plan Note (Signed)
Doing well on omeprazole. REfilled med today.

## 2010-05-24 NOTE — Progress Notes (Signed)
  Subjective:    Patient ID: Vickie Carter, female    DOB: 07-31-1960, 50 y.o.   MRN: [id number]  Hyperlipidemia  has lost 2 lbs since last here. She is on the propranolol for HA.  No lightheaded, no lighthheaded on the medication. HA are well controlled.   Has been using her inhaler with the increase in pollen. N ofever or URI sxs.  NO SOB. She has had some wheezing. Not using her symbicort.  Has been out for a long time.   Still taking her reflux med. Says she has sxs if she stops it.   Seeing psych and GI.      Review of Systems     Objective:   Physical Exam  Constitutional: She appears well-developed and well-nourished.  HENT:  Head: Normocephalic and atraumatic.  Eyes: Pupils are equal, round, and reactive to light.  Neck: Normal range of motion. Neck supple.  Cardiovascular: Normal rate, regular rhythm and normal heart sounds.   Pulmonary/Chest: Effort normal. No respiratory distress. She has wheezes. She has no rales.       Inspiratory wheeze bilaterally  Skin: Skin is warm and dry.  Psychiatric: She has a normal mood and affect.          Assessment & Plan:

## 2010-05-25 ENCOUNTER — Telehealth: Payer: Self-pay | Admitting: Family Medicine

## 2010-05-25 LAB — COMPLETE METABOLIC PANEL WITH GFR
Albumin: 4.4 g/dL (ref 3.5–5.2)
Alkaline Phosphatase: 94 U/L (ref 39–117)
BUN: 20 mg/dL (ref 6–23)
Calcium: 9.7 mg/dL (ref 8.4–10.5)
Chloride: 104 mEq/L (ref 96–112)
Creat: 1.17 mg/dL (ref 0.40–1.20)
GFR, Est African American: 59 mL/min — ABNORMAL LOW (ref >60)
GFR, Est Non African American: 49 mL/min — ABNORMAL LOW (ref >60)
Glucose, Bld: 112 mg/dL — ABNORMAL HIGH (ref 70–99)
Potassium: 4.5 mEq/L (ref 3.5–5.3)
Sodium: 137 mEq/L (ref 135–145)
Total Protein: 7.1 g/dL (ref 6.0–8.3)

## 2010-05-25 MED ORDER — CHOLINE FENOFIBRATE 135 MG PO CPDR: 135.0000 mg | DELAYED_RELEASE_CAPSULE | Freq: Every day | ORAL | Status: DC

## 2010-05-25 NOTE — Telephone Encounter (Signed)
Cal pt: Kidney function was borderline. We will monitor this. Also TG were very high. Will start tricor for this. This is not a statin. then recheck levels in 8 weeks.  Call the lab and see if can add a direct LDL.

## 2010-05-25 NOTE — Telephone Encounter (Signed)
Left message on pt vm with results and added lab

## 2010-05-25 NOTE — Telephone Encounter (Signed)
CAll pt: Vickie Carter is still very high. Would she consider Welchol. It is not a statin.

## 2010-05-27 NOTE — Telephone Encounter (Signed)
Call and left message on pt's vm to call and let us know if she wants med and we can call it into pharm

## 2010-06-14 ENCOUNTER — Ambulatory Visit (INDEPENDENT_AMBULATORY_CARE_PROVIDER_SITE_OTHER): Payer: BC Managed Care – PPO | Admitting: Family Medicine

## 2010-06-14 VITALS — BP 124/78 | HR 87 | Ht 65.0 in | Wt 195.0 lb

## 2010-06-14 DIAGNOSIS — K921 Melena: Secondary | ICD-10-CM

## 2010-06-14 DIAGNOSIS — R1013 Epigastric pain: Secondary | ICD-10-CM

## 2010-06-14 MED ORDER — DEXLANSOPRAZOLE 60 MG PO CPDR: 60.0000 mg | DELAYED_RELEASE_CAPSULE | Freq: Every day | ORAL | Status: DC

## 2010-06-14 NOTE — Patient Instructions (Signed)
Start the dexilant 60mg  daily today.   We will call you with the lab results.  If you pain worsening or start losing a lot of blood in your stool then go to the Emergency room.   We will refer you to Gastroenterology for further evaluation.

## 2010-06-14 NOTE — Progress Notes (Signed)
Subjective:    Patient ID: Vickie Carter, female    DOB: 1961-01-10, 50 y.o.   MRN: [id number]  HPI Started having abdominal pain about a week ago in the epigastric area.  No vomiting or diarrhea. Not exacerbated by 18. She has had decreased appetite.  No fever.  Then about 2 days ago started burping up a sulfa smell.  Started noticing bright red blood in the stool the last 2 days. Blood is bright red and the commode water will look red.  No pain with BM.  She takes miralax regularly.  Not using her mobic for the last 2 days.  She has not taken any stomach medications. She has had 2-3 soft BM dialy but not runny. The epigastric pain is more of a raw aching sensation. No alleviating or worsening symptoms.  Review of Systems  BP 124/78  Pulse 87  Ht 5\' 5"  (1.651 m)  Wt 195 lb (88.451 kg)  BMI 32.45 kg/m2    Allergies  Allergen Reactions  . Atorvastatin     REACTION: nausea  . Rosuvastatin     REACTION: nauseated.  . Simvastatin     REACTION: nausea    No past medical history on file.  Past Surgical History  Procedure Date  . Back surgery 2003    History   Social History  . Marital Status: Single    Spouse Name: N/A    Number of Children: N/A  . Years of Education: N/A   Occupational History  . Not on file.   Social History Main Topics  . Smoking status: Current Everyday Smoker    Types: Cigarettes  . Smokeless tobacco: Not on file  . Alcohol Use: No  . Drug Use: No  . Sexually Active: Not on file   Other Topics Concern  . Not on file   Social History Narrative   CNA-1. HS degree.  Single.  Regular exercise-no    Family History  Problem Relation Age of Onset  . Depression Sister   . Alcohol abuse Brother     Current outpatient prescriptions:albuterol (VENTOLIN HFA) 108 (90 BASE) MCG/ACT inhaler, Inhale 2 puffs into the lungs every 4 (four) hours as needed for wheezing or shortness of breath. 2-4 puffs inhaled bid prn, Disp: 1 Inhaler, Rfl: 2;  ALPRAZolam  (XANAX) 0.5 MG tablet, Take 0.5 mg by mouth 3 (three) times daily as needed. For anxiety , Disp: , Rfl:  budesonide-formoterol (SYMBICORT) 80-4.5 MCG/ACT inhaler, Inhale 2 puffs into the lungs 2 (two) times daily., Disp: 1 Inhaler, Rfl: 2;  cyclobenzaprine (FLEXERIL) 10 MG tablet, Take 10 mg by mouth daily.  , Disp: , Rfl: ;  FLUoxetine (PROZAC) 20 MG capsule, Take 20 mg by mouth daily.  , Disp: , Rfl: ;  Hyoscyamine Sulfate 0.375 MG CP12, Take by mouth.  , Disp: , Rfl:  meloxicam (MOBIC) 7.5 MG tablet, Take 1 tablet (7.5 mg total) by mouth daily. PRN , Disp: 60 tablet, Rfl: 3;  omeprazole (PRILOSEC) 40 MG capsule, Take 1 capsule (40 mg total) by mouth daily., Disp: 30 capsule, Rfl: 6;  Polyethylene Glycol 3350 POWD, by Does not apply route. 1 capful mixed w/ a glass of water daily , Disp: , Rfl: ;  propranolol (INDERAL) 40 MG tablet, Take 1 tablet (40 mg total) by mouth 2 (two) times daily., Disp: 60 tablet, Rfl: 6 traZODone (DESYREL) 50 MG tablet, Take 1 tablet (50 mg total) by mouth at bedtime. For sleep, Disp: 30 tablet, Rfl: 6;  Choline  Fenofibrate (TRILIPIX) 135 MG capsule, Take 1 capsule (135 mg total) by mouth daily., Disp: 30 capsule, Rfl: 2;  dexlansoprazole (DEXILANT) 60 MG capsule, Take 1 capsule (60 mg total) by mouth daily., Disp: 10 capsule, Rfl: 0     Objective:   Physical Exam  Constitutional: She appears well-developed and well-nourished.  HENT:  Head: Normocephalic and atraumatic.  Cardiovascular: Normal rate, regular rhythm and normal heart sounds.   Pulmonary/Chest: Effort normal and breath sounds normal.  Abdominal: Soft. Bowel sounds are normal. She exhibits no distension and no mass. There is tenderness. There is no rebound and no guarding.       Tender in teh LUQ and the epigastrum.   Genitourinary:       Rectal exam with fairly large external hemorrhoids. None appear to be thrombosed. Normal sphincter tone. She did have significant discomfort on rectal exam. Hemoccult was  positive.  Musculoskeletal: She exhibits no edema.  Skin: Skin is warm and dry.  Psychiatric: She has a normal mood and affect.          Assessment & Plan:   epigastric pain-this does radiate into the left upper quadrant. We'll get labs today to rule out an acute infection and to evaluate her pancreas. We did give her a GI cocktail here in the office and she actually did feel better afterwards. I am most suspicious of gastritis or possibly peptic ulcer. I did give her samples of Exelon to start for the next 10 days until we get her lab work back.  Bright red blood in stool-she did have large external hemorrhoids on exam. None of them appear to be actively bleeding but certainly she could have internal hemorrhoids that could be bleeding. I would like her to refer her to gastroenterology for further evaluation. If she starts to have significant bleeding and she needs to go to the emergency department overnight. Also I want her to stop any kind of NSAID like adult or the Mobic. That sounds like she or he stopped all of these several days ago.

## 2010-06-15 ENCOUNTER — Telehealth: Payer: Self-pay | Admitting: Family Medicine

## 2010-06-15 LAB — CBC WITH DIFFERENTIAL/PLATELET
HCT: 42.7 % (ref 36.0–46.0)
Hemoglobin: 14.4 g/dL (ref 12.0–15.0)
MCH: 30.1 pg (ref 26.0–34.0)
MCHC: 33.7 g/dL (ref 30.0–36.0)
Neutro Abs: 7.4 10*3/uL (ref 1.7–7.7)
Neutrophils Relative %: 59 % (ref 43–77)
WBC: 12.5 10*3/uL — ABNORMAL HIGH (ref 4.0–10.5)

## 2010-06-15 LAB — COMPLETE METABOLIC PANEL WITH GFR
BUN: 20 mg/dL (ref 6–23)
Calcium: 10.1 mg/dL (ref 8.4–10.5)
Creat: 1.13 mg/dL (ref 0.40–1.20)
GFR, Est African American: >60 mL/min (ref >60)
GFR, Est Non African American: 51 mL/min — ABNORMAL LOW (ref >60)
Glucose, Bld: 91 mg/dL (ref 70–99)
Total Bilirubin: 0.2 mg/dL — ABNORMAL LOW (ref 0.3–1.2)
Total Protein: 7.3 g/dL (ref 6.0–8.3)

## 2010-06-15 LAB — LIPASE: Lipase: 24 U/L (ref 0–75)

## 2010-06-15 LAB — AMYLASE: Amylase: 30 U/L (ref 0–105)

## 2010-06-15 NOTE — Telephone Encounter (Signed)
Call pt: No anemia. Labs are ok. Take teh dexilant and we are working on the referral to GI for the blood in your stools.

## 2010-06-15 NOTE — Telephone Encounter (Signed)
Pt notified.pt has appt with GI in June already. Advised her to call and see if she can get an appt with him sooner for the bld in stool

## 2010-06-23 ENCOUNTER — Ambulatory Visit (INDEPENDENT_AMBULATORY_CARE_PROVIDER_SITE_OTHER): Payer: BC Managed Care – PPO | Admitting: Family Medicine

## 2010-06-23 DIAGNOSIS — R300 Dysuria: Secondary | ICD-10-CM

## 2010-06-23 DIAGNOSIS — N390 Urinary tract infection, site not specified: Secondary | ICD-10-CM | POA: Insufficient documentation

## 2010-06-23 LAB — POCT URINALYSIS DIPSTICK
Ketones, UA: NEGATIVE
Leukocytes, UA: NEGATIVE
Urobilinogen, UA: 0.2

## 2010-06-23 MED ORDER — CIPROFLOXACIN HCL 250 MG PO TABS: 250.0000 mg | ORAL_TABLET | Freq: Two times a day (BID) | ORAL | Status: AC

## 2010-06-23 NOTE — Progress Notes (Signed)
  Subjective:    Patient ID: Vickie Carter, female    DOB: [birth year], 50 y.o.   MRN: [id number]  HPI bladder spasms x 1 day   Review of Systems     Objective:   Physical Exam        Assessment & Plan:

## 2010-06-23 NOTE — Assessment & Plan Note (Signed)
UA + today.  Will treat with 3 days of Cipro.  Instructions given to pt.  Call if not improved in 3 days.

## 2010-06-23 NOTE — Patient Instructions (Signed)
UA + for infection.  Treat with Cipro 250 mg twice daily x 3 days. Drink plenty of water. Avoid intercourse and caffeine during treatment.  Call if not resolved in 3 days.

## 2010-07-01 NOTE — Telephone Encounter (Signed)
Please mail a letter

## 2010-07-26 ENCOUNTER — Other Ambulatory Visit: Payer: Self-pay | Admitting: *Deleted

## 2010-07-26 MED ORDER — POLYETHYLENE GLYCOL 3350 POWD: Status: DC

## 2010-08-03 ENCOUNTER — Encounter: Payer: Self-pay | Admitting: Family Medicine

## 2010-08-03 ENCOUNTER — Ambulatory Visit (INDEPENDENT_AMBULATORY_CARE_PROVIDER_SITE_OTHER): Payer: BC Managed Care – PPO | Admitting: Family Medicine

## 2010-08-03 VITALS — BP 107/72 | HR 69 | Temp 97.6°F | Ht 64.0 in | Wt 194.0 lb

## 2010-08-03 DIAGNOSIS — J40 Bronchitis, not specified as acute or chronic: Secondary | ICD-10-CM

## 2010-08-03 MED ORDER — ALBUTEROL SULFATE (2.5 MG/3ML) 0.083% IN NEBU
2.5000 mg | INHALATION_SOLUTION | Freq: Once | RESPIRATORY_TRACT | Status: AC
  Administered 2010-08-03: 2.5 mg via RESPIRATORY_TRACT

## 2010-08-03 MED ORDER — DOXYCYCLINE HYCLATE 100 MG PO TABS: 100.0000 mg | ORAL_TABLET | Freq: Two times a day (BID) | ORAL | Status: AC

## 2010-08-03 MED ORDER — BECLOMETHASONE DIPROPIONATE 40 MCG/ACT IN AERS: 2.0000 | INHALATION_SPRAY | Freq: Two times a day (BID) | RESPIRATORY_TRACT | Status: DC

## 2010-08-03 NOTE — Patient Instructions (Signed)
Needs to get in for spirometry when able.

## 2010-08-03 NOTE — Progress Notes (Signed)
  Subjective:    Patient ID: Vickie Carter, female    DOB: 1960/11/09, 50 y.o.   MRN: [id number]  Cough The current episode started in the past 7 days (5 days. ). The problem has been gradually worsening. Cough characteristics: productive initally and now dry. Associated symptoms include chills, a fever and myalgias. Pertinent negatives include no ear congestion, ear pain, headaches, nasal congestion, postnasal drip, rash, rhinorrhea, sore throat or shortness of breath. The symptoms are aggravated by nothing. Treatments tried: On clindamycin for a dental infection. The treatment provided mild relief.  Some wheezing. Using her inhaler about twice a day. Had some chest tightness for the last couple of days.     Review of Systems  Constitutional: Positive for fever and chills.  HENT: Negative for ear pain, sore throat, rhinorrhea and postnasal drip.   Respiratory: Positive for cough. Negative for shortness of breath.   Musculoskeletal: Positive for myalgias.  Skin: Negative for rash.  Neurological: Negative for headaches.       Objective:   Physical Exam  Constitutional: She is oriented to person, place, and time. She appears well-developed and well-nourished.  HENT:  Head: Normocephalic and atraumatic.  Right Ear: External ear normal.  Left Ear: External ear normal.  Nose: Nose normal.  Mouth/Throat: Oropharynx is clear and moist.       TMs and canals are clear.   Eyes: Conjunctivae and EOM are normal. Pupils are equal, round, and reactive to light.  Neck: Neck supple. No thyromegaly present.  Cardiovascular: Normal rate, regular rhythm and normal heart sounds.   Pulmonary/Chest: Effort normal. She has wheezes.       Inspiratory wheeze at the right lung base.   Lymphadenopathy:    She has no cervical adenopathy.  Neurological: She is alert and oriented to person, place, and time.  Skin: Skin is warm and dry.  Psychiatric: She has a normal mood and affect.           Assessment & Plan:  Bronchitis - She has been using her albuterol twice a day.  Discussed that it is now complicated because she has just finished clindamycin.  Increase albuterol to 4 x a day.  Reminded her she still needs to get in for spirometry when able. Given albuterol neb in the office and no sig change. Peak flow still in the yellow zone.  F/U in 3 weeks for spirometry. I suspect she has COPD but need to confirm dx. Will start an inhaled steroid to get her under better control.

## 2010-08-05 ENCOUNTER — Telehealth: Payer: Self-pay | Admitting: Family Medicine

## 2010-08-05 MED ORDER — PREDNISONE 20 MG PO TABS: ORAL_TABLET | ORAL | Status: DC

## 2010-08-05 NOTE — Telephone Encounter (Signed)
Pt called this afternoon and is saying she is feeling worse.  Seen this past Tuesday.  York Spaniel it was questionable asthma vs COPD and that a determination had not been made as of yet.  Still C/O bad cough, chest hurting.  Has doctor note for work and she is suppose to return to work but does not feel she can.  Has QVAR MDI and doxy she was given.  Please advise. Olan:  ROuted to Dr. Marlyne Beards, LPN [facility]

## 2010-08-05 NOTE — Telephone Encounter (Signed)
OK will add steroids. Will send to pharmacy.  OK for out of work note for today and tomorrow.

## 2010-08-06 NOTE — Telephone Encounter (Signed)
Pt informed and wants her work note mailed to her. Jarvis Newcomer, LPN [facility]

## 2010-08-23 ENCOUNTER — Ambulatory Visit: Payer: BC Managed Care – PPO | Admitting: Family Medicine

## 2010-10-12 ENCOUNTER — Encounter: Payer: Self-pay | Admitting: Family Medicine

## 2010-10-22 ENCOUNTER — Encounter: Payer: Self-pay | Admitting: Family Medicine

## 2010-10-22 ENCOUNTER — Ambulatory Visit (INDEPENDENT_AMBULATORY_CARE_PROVIDER_SITE_OTHER): Payer: BC Managed Care – PPO | Admitting: Family Medicine

## 2010-10-22 VITALS — BP 113/69 | HR 82 | Ht 64.0 in | Wt 195.0 lb

## 2010-10-22 DIAGNOSIS — Z23 Encounter for immunization: Secondary | ICD-10-CM

## 2010-10-22 DIAGNOSIS — R062 Wheezing: Secondary | ICD-10-CM

## 2010-10-22 DIAGNOSIS — J42 Unspecified chronic bronchitis: Secondary | ICD-10-CM

## 2010-10-22 NOTE — Progress Notes (Signed)
  Subjective:    Patient ID: Vickie Carter, female    DOB: Jan 26, 1961, 50 y.o.   MRN: [id number]  HPI  She is here today for spirometry. She's had multiple episodes of recurrent bronchitis this year. She is also a smoker. She says she knows she needs to quit but is not ready to quit. At her last office visit approximately a month ago he finally ended up placing her on steroids. She does note that she feels better. She says she smokes them as to when she goes gambling.  Review of Systems     Objective:   Physical Exam  Constitutional: She is oriented to person, place, and time. She appears well-developed and well-nourished.  HENT:  Head: Normocephalic and atraumatic.  Cardiovascular: Normal rate, regular rhythm and normal heart sounds.   Pulmonary/Chest: Effort normal and breath sounds normal.  Neurological: She is alert and oriented to person, place, and time.  Skin: Skin is warm and dry.  Psychiatric: She has a normal mood and affect. Her behavior is normal.          Assessment & Plan:  Recurrent bronchitis-spirometry today shows an FVC of 93% predicted with an FEV1 of 83% predicted. Her ratio is 73%. I explained to her that technically she is still normal but she is suffering on the border. We discussed the importance of smoking cessation to help maintain the lung function that she has to keep her from developing COPD. This med she had no significant changes in her flow values. We also discussed how smoking is an irritant to the lungs and makes it more difficult for her to heal well when she does get a cold or cough. Patient does know she needs to quit but is not ready at this time. She says she will work on it.

## 2010-10-25 ENCOUNTER — Other Ambulatory Visit: Payer: Self-pay | Admitting: Family Medicine

## 2010-11-23 ENCOUNTER — Other Ambulatory Visit: Payer: Self-pay | Admitting: Family Medicine

## 2010-12-06 ENCOUNTER — Encounter: Payer: Self-pay | Admitting: Emergency Medicine

## 2010-12-06 ENCOUNTER — Inpatient Hospital Stay (INDEPENDENT_AMBULATORY_CARE_PROVIDER_SITE_OTHER)
Admission: RE | Admit: 2010-12-06 | Discharge: 2010-12-06 | Disposition: A | Discharge status: Home / Self Care | Payer: BC Managed Care – PPO | Source: Ambulatory Visit | Attending: Emergency Medicine | Admitting: Emergency Medicine

## 2010-12-06 DIAGNOSIS — N390 Urinary tract infection, site not specified: Secondary | ICD-10-CM

## 2010-12-06 DIAGNOSIS — J45901 Unspecified asthma with (acute) exacerbation: Secondary | ICD-10-CM | POA: Insufficient documentation

## 2010-12-06 DIAGNOSIS — R300 Dysuria: Secondary | ICD-10-CM | POA: Insufficient documentation

## 2010-12-06 DIAGNOSIS — M549 Dorsalgia, unspecified: Secondary | ICD-10-CM

## 2010-12-06 LAB — CONVERTED CEMR LAB
Nitrite: NEGATIVE
Protein, U semiquant: NEGATIVE
pH: 5.5

## 2010-12-09 ENCOUNTER — Telehealth (INDEPENDENT_AMBULATORY_CARE_PROVIDER_SITE_OTHER): Payer: Self-pay | Admitting: *Deleted

## 2011-01-24 NOTE — Progress Notes (Signed)
Summary: Burning in Vaginal Area/Neck Pain   Vital Signs:  Patient Profile:   50 Years Old Female CC:      dysuria x yesterday, neck pain x 1 week Height:     65 inches Weight:      196 pounds O2 Sat:      96 % O2 treatment:    Room Air Temp:     98.0 degrees F oral Pulse rate:   71 / minute Resp:     14 per minute BP sitting:   121 / 79  (left arm) Cuff size:   large  Vitals Entered By: Lajean Saver RN (December 06, 2010 11:12 AM)                  Updated Prior Medication List: ALPRAZOLAM 0.5 MG TABS (ALPRAZOLAM) Take one tablet by mouth three times a day as needed anxiety PROPRANOLOL HCL 40 MG TABS (PROPRANOLOL HCL) Take 1 tablet by mouth two times a day POLYETHYLENE GLYCOL 3350  POWD (POLYETHYLENE GLYCOL 3350) 1 capful mixed with a glass of water once a day PROZAC 20 MG CAPS (FLUOXETINE HCL) Take 1 tablet by mouth once a day VENTOLIN HFA 108 (90 BASE) MCG/ACT AERS (ALBUTEROL SULFATE) 2- 4 puff inhaled two times a day as needed SYMBICORT 160-4.5 MCG/ACT AERO (BUDESONIDE-FORMOTEROL FUMARATE) 2 puffs inhaled two times a day TRAZODONE HCL 50 MG TABS (TRAZODONE HCL) take 1-2 tablets by mouth at bedtime for sleep OMEPRAZOLE 40 MG CPDR (OMEPRAZOLE) 1 tab by mouth qAM, take 30 min before breakfast CYCLOBENZAPRINE HCL 10 MG TABS (CYCLOBENZAPRINE HCL) 1 tab by mouth hs HYOSCYAMINE SULFATE CR 0.375 MG XR12H-TAB (HYOSCYAMINE SULFATE)   Current Allergies (reviewed today): ! CRESTOR (ROSUVASTATIN CALCIUM) ! LIPITOR (ATORVASTATIN CALCIUM) ! SIMVASTATIN (SIMVASTATIN)History of Present Illness Chief Complaint: dysuria x yesterday, neck pain x 1 week History of Present Illness: 25) 50 Years Old Female complains of UTI symptoms for 1 days.  She describes the pain as burning during urination and pain around vagina.  She has not used any OTC meds. +/- dysuria No frequency No urgency No hematuria No vaginal discharge + odor, vaginal tenderness (she is not sexually active) No  fever/chills No lower abdominal pain No back pain No fatigue   2) L sided neck pain.  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  Over the past few days is feeling better.  Feels stiff, but improving.  Not using any meds.  REVIEW OF SYSTEMS Constitutional Symptoms      Denies fever, chills, night sweats, weight loss, weight gain, and fatigue.  Eyes       Denies change in vision, eye pain, eye discharge, glasses, contact lenses, and eye surgery. Ear/Nose/Throat/Mouth       Denies hearing loss/aids, change in hearing, ear pain, ear discharge, dizziness, frequent runny nose, frequent nose bleeds, sinus problems, sore throat, hoarseness, and tooth pain or bleeding.  Respiratory       Denies dry cough, productive cough, wheezing, shortness of breath, asthma, bronchitis, and emphysema/COPD.  Cardiovascular       Denies murmurs, chest pain, and tires easily with exhertion.    Gastrointestinal       Denies stomach pain, nausea/vomiting, diarrhea, constipation, blood in bowel movements, and indigestion. Genitourniary       Complains of painful urination.      Denies kidney stones and loss of urinary control. Neurological       Denies paralysis, seizures, and fainting/blackouts.  Musculoskeletal       Complains of joint stiffness.      Denies muscle pain, joint pain, decreased range of motion, redness, swelling, muscle weakness, and gout.  Skin       Denies bruising, unusual mles/lumps or sores, and hair/skin or nail changes.  Psych       Denies mood changes, temper/anger issues, anxiety/stress, speech problems, depression, and sleep problems. Other Comments: Patient c/o dysuria x last night . She also c/o neck pain x 1 week. She has used bengay for her neck pain.    Past History:  Past Medical History: chronic back pain Hyperlipidemia Asthma  Past Surgical History: Reviewed history from 06/23/2009 and no changes required. Back surgery 2003  Family  History: Reviewed history from 07/24/2008 and no changes required. Brother with alcoholism,  Sister with depression  Social History: Reviewed history from 07/24/2008 and no changes required. CNA-1. HS degree.  Single.   Current Smoker Alcohol use-no Drug use-no Regular exercise-no Physical Exam General appearance: well developed, well nourished, no acute distress Abdomen: soft, non-tender without obvious organomegaly GU: cva tenderness MSE: oriented to time, place, and person Neck: L upper trap/scalene tenderness, FROM, spurling neg, no meningitic signs Assessment New Problems: BACK PAIN (ICD-724.5) URINARY TRACT INFECTION (ICD-599.0) DYSURIA (ICD-788.1) ASTHMA (ICD-493.90)   Plan New Medications/Changes: METRONIDAZOLE 500 MG TABS (METRONIDAZOLE) 1 by mouth two times a day for 7 days  #17 x 0, 12/06/2010, Hoyt Koch MD FLUCONAZOLE 150 MG TABS (FLUCONAZOLE) 1 by mouth x1 dose, may repeat in 3 days if needed  #2 x 0, 12/06/2010, Hoyt Koch MD CIPROFLOXACIN HCL 500 MG TABS (CIPROFLOXACIN HCL) 1 by mouth two times a day for 5 days  #10 x 0, 12/06/2010, Hoyt Koch MD  New Orders: T-Culture, Urine [[id number]] Est. Patient Level IV [86578] UA Dipstick w/o Micro (automated)  [81003] Planning Comments:   1) Will treat for UTI, but symptoms are conflicting so will also give a diflucan just in case.  Due to odor & irritation, will also place on Flagyl.  If not improving, needs to see PCP or Gyn. 2) Neck spasm/strain.  Rest, heating pad, motrin as needed.  Follow-up with your primary care physician if not improving or if getting worse   The patient and/or caregiver has been counseled thoroughly with regard to medications prescribed including dosage, schedule, interactions, rationale for use, and possible side effects and they verbalize understanding.  Diagnoses and expected course of recovery discussed and will return if not improved as expected or if the condition  worsens. Patient and/or caregiver verbalized understanding.  Prescriptions: METRONIDAZOLE 500 MG TABS (METRONIDAZOLE) 1 by mouth two times a day for 7 days  #17 x 0   Entered and Authorized by:   Hoyt Koch MD   Signed by:   Hoyt Koch MD on 12/06/2010   Method used:   Print then Give to Patient   RxID:   [id number] FLUCONAZOLE 150 MG TABS (FLUCONAZOLE) 1 by mouth x1 dose, may repeat in 3 days if needed  #2 x 0   Entered and Authorized by:   Hoyt Koch MD   Signed by:   Hoyt Koch MD on 12/06/2010   Method used:   Print then Give to Patient   RxID:   [id number] CIPROFLOXACIN HCL 500 MG TABS (CIPROFLOXACIN HCL) 1 by mouth two times a day for 5 days  #10 x 0   Entered and Authorized by:   Hoyt Koch MD   Signed by:   Tinnie Gens  Angelis Gates MD on 12/06/2010   Method used:   Print then Give to Patient   RxID:   [id number]   Orders Added: 1)  T-Culture, Urine [[id number]] 2)  Est. Patient Level IV [96295] 3)  UA Dipstick w/o Micro (automated)  [81003]    Laboratory Results   Urine Tests  Date/Time Received: December 06, 2010 11:17 AM  Date/Time Reported: December 06, 2010 11:17 AM   Routine Urinalysis   Color: yellow Appearance: Cloudy Glucose: negative   (Normal Range: Negative) Bilirubin: negative   (Normal Range: Negative) Ketone: negative   (Normal Range: Negative) Spec. Gravity: 1.015   (Normal Range: 1.003-1.035) Blood: 1+   (Normal Range: Negative) pH: 5.5   (Normal Range: 5.0-8.0) Protein: negative   (Normal Range: Negative) Urobilinogen: 0.2   (Normal Range: 0-1) Nitrite: negative   (Normal Range: Negative) Leukocyte Esterace: trace   (Normal Range: Negative)

## 2011-01-24 NOTE — Letter (Signed)
Summary: Out of Work  [facility]  [address]   [place], [place] [address]   Phone: [phone]  Fax: [phone]    December 06, 2010   Employee:  ADELENA DESANTIAGO    To Whom It May Concern:   For Medical reasons, please excuse the above named employee from work for the following dates:    Oct 15-16, 2012           Sincerely,    Hoyt Koch MD

## 2011-01-24 NOTE — Telephone Encounter (Signed)
  Phone Note Outgoing Call Call back at [facility] Phone [phone]   Call placed by: Lajean Saver RN,  December 09, 2010 1:28 PM Call placed to: Patient Summary of Call: Callback: No answer. Message left to call back with questions or concerns

## 2011-02-23 ENCOUNTER — Other Ambulatory Visit: Payer: Self-pay | Admitting: Family Medicine

## 2011-05-25 ENCOUNTER — Other Ambulatory Visit: Payer: Self-pay | Admitting: Family Medicine

## 2011-07-19 ENCOUNTER — Encounter: Payer: Self-pay | Admitting: *Deleted

## 2011-07-19 ENCOUNTER — Emergency Department
Admission: EM | Admit: 2011-07-19 | Discharge: 2011-07-19 | Disposition: A | Discharge status: Home / Self Care | Payer: BC Managed Care – PPO | Source: Home / Self Care | Attending: Family Medicine | Admitting: Family Medicine

## 2011-07-19 DIAGNOSIS — L03012 Cellulitis of left finger: Secondary | ICD-10-CM

## 2011-07-19 DIAGNOSIS — L03019 Cellulitis of unspecified finger: Secondary | ICD-10-CM

## 2011-07-19 HISTORY — DX: Reserved for inherently not codable concepts without codable children: IMO0001

## 2011-07-19 HISTORY — DX: Migraine, unspecified, not intractable, without status migrainosus: G43.909

## 2011-07-19 HISTORY — DX: Gastro-esophageal reflux disease without esophagitis: K21.9

## 2011-07-19 HISTORY — DX: Other specified personal risk factors, not elsewhere classified: Z91.89

## 2011-07-19 MED ORDER — CEPHALEXIN 500 MG PO CAPS: 500.0000 mg | ORAL_CAPSULE | Freq: Three times a day (TID) | ORAL | Status: AC

## 2011-07-19 NOTE — Discharge Instructions (Signed)
Begin warm soaks two or three times daily.  Change bandage daily.  Paronychia Paronychia is an inflammatory reaction involving the folds of the skin surrounding the fingernail. This is commonly caused by an infection in the skin around a nail. The most common cause of paronychia is frequent wetting of the hands (as seen with bartenders, food servers, nurses or others who wet their hands). This makes the skin around the fingernail susceptible to infection by bacteria (germs) or fungus. Other predisposing factors are:  Aggressive manicuring.   Nail biting.   Thumb sucking.  The most common cause is a staphylococcal (a type of germ) infection, or a fungal (Candida) infection. When caused by a germ, it usually comes on suddenly with redness, swelling, pus and is often painful. It may get under the nail and form an abscess (collection of pus), or form an abscess around the nail. If the nail itself is infected with a fungus, the treatment is usually prolonged and may require oral medicine for up to one year. Your caregiver will determine the length of time treatment is required. The paronychia caused by bacteria (germs) may largely be avoided by not pulling on hangnails or picking at cuticles. When the infection occurs at the tips of the finger it is called felon. When the cause of paronychia is from the herpes simplex virus (HSV) it is called herpetic whitlow. TREATMENT  When an abscess is present treatment is often incision and drainage. This means that the abscess must be cut open so the pus can get out. When this is done, the following home care instructions should be followed. HOME CARE INSTRUCTIONS   It is important to keep the affected fingers very dry. Rubber or plastic gloves over cotton gloves should be used whenever the hand must be placed in water.   Keep wound clean, dry and dressed as suggested by your caregiver between warm soaks or warm compresses.   Soak in warm water for fifteen to  twenty minutes three to four times per day for bacterial infections. Fungal infections are very difficult to treat, so often require treatment for long periods of time.   For bacterial (germ) infections take antibiotics (medicine which kill germs) as directed and finish the prescription, even if the problem appears to be solved before the medicine is gone.   Only take over-the-counter or prescription medicines for pain, discomfort, or fever as directed by your caregiver.  SEEK IMMEDIATE MEDICAL CARE IF:  You have redness, swelling, or increasing pain in the wound.   You notice pus coming from the wound.   You have a fever.   You notice a bad smell coming from the wound or dressing.  Document Released: 08/03/2000 Document Revised: 01/27/2011 Document Reviewed: 04/04/2008 [facility] Patient Information 2012 [place], [place].

## 2011-07-19 NOTE — ED Notes (Signed)
Patient c/o pain, swelling and infection @ base of left thumb nail x 1 week.

## 2011-07-19 NOTE — ED Provider Notes (Signed)
History     CSN: [id number]  Arrival date & time 07/19/11  1756   First MD Initiated Contact with Patient 07/19/11 1835      Chief Complaint  Patient presents with  . Wound Infection    left thumb      HPI Comments: Patient complains of pain and swelling at the base of her left thumb for one week.  She used a needle to open the swollen area and drained some pus.  The area remains tender and swollen  Patient is a 51 y.o. female presenting with hand pain. The history is provided by the patient.  Hand Pain This is a new problem. The current episode started more than 1 week ago. The problem occurs constantly. The problem has been gradually worsening. Associated symptoms comments: none. Exacerbated by: using thumb. The symptoms are relieved by nothing.    Past Medical History  Diagnosis Date  . Reflux   . Migraines   . Asthma   . History of drug overdose     trazadone    Past Surgical History  Procedure Date  . Back surgery 2003    Family History  Problem Relation Age of Onset  . Depression Sister   . Alcohol abuse Brother     History  Substance Use Topics  . Smoking status: Current Everyday Smoker    Types: Cigarettes  . Smokeless tobacco: Not on file  . Alcohol Use: No    OB History    Grav Para Term Preterm Abortions TAB SAB Ect Mult Living                  Review of Systems  Constitutional: Negative for fever.  All other systems reviewed and are negative.    Allergies  Atorvastatin; Penicillins; Rosuvastatin; and Simvastatin  Home Medications   Current Outpatient Rx  Name Route Sig Dispense Refill  . ALBUTEROL SULFATE HFA 108 (90 BASE) MCG/ACT IN AERS Inhalation Inhale 2 puffs into the lungs every 4 (four) hours as needed for wheezing or shortness of breath. 2-4 puffs inhaled bid prn 1 Inhaler 2  . ALPRAZOLAM 0.5 MG PO TABS Oral Take 0.5 mg by mouth 3 (three) times daily as needed. For anxiety     . BECLOMETHASONE DIPROPIONATE 40 MCG/ACT IN AERS  Inhalation Inhale 2 puffs into the lungs 2 (two) times daily. 1 Inhaler 1  . BUDESONIDE-FORMOTEROL FUMARATE 80-4.5 MCG/ACT IN AERO Inhalation Inhale 2 puffs into the lungs 2 (two) times daily. 1 Inhaler 2  . CEPHALEXIN 500 MG PO CAPS Oral Take 1 capsule (500 mg total) by mouth 3 (three) times daily. 21 capsule 0  . CHOLINE FENOFIBRATE 135 MG PO CPDR Oral Take 1 capsule (135 mg total) by mouth daily. 30 capsule 2  . CYCLOBENZAPRINE HCL 10 MG PO TABS Oral Take 10 mg by mouth daily.      Marland Kitchen FLUOXETINE HCL 20 MG PO CAPS Oral Take 20 mg by mouth daily.      Marland Kitchen HYOSCYAMINE SULFATE 0.375 MG PO CP12 Oral Take by mouth.      . MELOXICAM 7.5 MG PO TABS Oral Take 1 tablet (7.5 mg total) by mouth daily. PRN  60 tablet 3  . OMEPRAZOLE 40 MG PO CPDR Oral Take 1 capsule (40 mg total) by mouth daily. 30 capsule 6  . POLYETHYLENE GLYCOL 3350 POWD  1 capful mixed w/ a glass of water daily 527 g 2  . POLYETHYLENE GLYCOL 3350 PO POWD  MIX 1 CAPFUL  WITH A GLASS OF WATER ONCE DAILY 527 g 2  . PROPRANOLOL HCL 40 MG PO TABS  TAKE 1 TABLET TWICE A DAY 60 tablet 0    Must make appointment  . TRAZODONE HCL 50 MG PO TABS Oral Take 1 tablet (50 mg total) by mouth at bedtime. For sleep 30 tablet 6    BP 117/79  Pulse 70  Temp(Src) 97.6 F (36.4 C) (Oral)  Resp 16  Ht 5\' 4"  (1.626 m)  Wt 209 lb (94.802 kg)  BMI 35.87 kg/m2  SpO2 95%  Physical Exam  Nursing note and vitals reviewed. Constitutional: She is oriented to person, place, and time. She appears well-developed and well-nourished. No distress.       Patient is obese (BMI 35.9)   Eyes: Conjunctivae are normal. Pupils are equal, round, and reactive to light.  Musculoskeletal: She exhibits tenderness.       Left hand: She exhibits tenderness and swelling. normal sensation noted.       Hands:      The ulnar edge of left thumb fingernail is erythematous, tender, and fluctuant.  Distal Neurovascular function is intact.   Neurological: She is alert and oriented to  person, place, and time.  Skin: Skin is warm and dry.    ED Course  Procedures  Drain paronychia.  Using sterile technique and an 18ga needle, made incision along edge of fingernail of left thumb at site of small abscess (no anesthetic necessary).  Expressed purulent material for culture.  Applied Bacitracin and bandage.  Patient tolerated well.   Labs Reviewed  WOUND CULTURE pending      1. Paronychia of left thumb       MDM   Wound culture taken. Begin Keflex. Begin warm soaks two or three times daily.  Change bandage daily. Followup with Family Doctor if not improved in about 5 days.        Lattie Haw, MD 07/22/11 1310

## 2011-07-27 ENCOUNTER — Other Ambulatory Visit: Payer: Self-pay | Admitting: Family Medicine

## 2011-09-09 ENCOUNTER — Emergency Department (INDEPENDENT_AMBULATORY_CARE_PROVIDER_SITE_OTHER): Payer: BC Managed Care – PPO

## 2011-09-09 ENCOUNTER — Emergency Department
Admission: EM | Admit: 2011-09-09 | Discharge: 2011-09-09 | Disposition: A | Discharge status: Home / Self Care | Payer: BC Managed Care – PPO | Source: Home / Self Care

## 2011-09-09 DIAGNOSIS — J069 Acute upper respiratory infection, unspecified: Secondary | ICD-10-CM

## 2011-09-09 DIAGNOSIS — R062 Wheezing: Secondary | ICD-10-CM

## 2011-09-09 DIAGNOSIS — R05 Cough: Secondary | ICD-10-CM

## 2011-09-09 DIAGNOSIS — J441 Chronic obstructive pulmonary disease with (acute) exacerbation: Secondary | ICD-10-CM

## 2011-09-09 MED ORDER — METHYLPREDNISOLONE SODIUM SUCC 125 MG IJ SOLR
125.0000 mg | Freq: Once | INTRAMUSCULAR | Status: AC
  Administered 2011-09-09: 125 mg via INTRAMUSCULAR

## 2011-09-09 MED ORDER — DOXYCYCLINE HYCLATE 100 MG PO CAPS: 100.0000 mg | ORAL_CAPSULE | Freq: Two times a day (BID) | ORAL | Status: AC

## 2011-09-09 MED ORDER — PREDNISONE 50 MG PO TABS: ORAL_TABLET | ORAL | Status: AC

## 2011-09-09 NOTE — ED Notes (Signed)
Vickie Carter complains of productive cough with yellow sputum and wheezing for 3 days. Denies fever, chills or sweats.

## 2011-09-09 NOTE — ED Provider Notes (Signed)
History     CSN: [id number]  Arrival date & time 09/09/11  1521   First MD Initiated Contact with Patient 09/09/11 1523      Chief Complaint  Patient presents with  . Cough    3 days  . Wheezing    3 days    HPI Comments: URI Symptoms Onset: 3 days  Description: cough, sputum production, nasal congestion, wheezing, increased WOB Modifying factors:  Smoker, borderline PFTs concerning for COPD.   Symptoms Nasal discharge: mild Fever: no Sore throat: no Cough: yes Wheezing: yes Ear pain: no GI symptoms: no Sick contacts: yes  Red Flags  Stiff neck: no Dyspnea: yes Rash: no Swallowing difficulty: no  Sinusitis Risk Factors Headache/face pain: no Double sickening: no tooth pain: no  Allergy Risk Factors Sneezing: no Itchy scratchy throat: no Seasonal symptoms: no  Flu Risk Factors Headache: no muscle aches: no severe fatigue: no     Patient is a 51 y.o. female presenting with cough and wheezing.  Cough Associated symptoms include wheezing.  Wheezing  Associated symptoms include cough and wheezing.    Past Medical History  Diagnosis Date  . Reflux   . Migraines   . Asthma   . History of drug overdose     trazadone    Past Surgical History  Procedure Date  . Back surgery 2003    Family History  Problem Relation Age of Onset  . Depression Sister   . Alcohol abuse Brother     History  Substance Use Topics  . Smoking status: Current Everyday Smoker -- 1.0 packs/day for 35 years    Types: Cigarettes  . Smokeless tobacco: Never Used  . Alcohol Use: No    OB History    Grav Para Term Preterm Abortions TAB SAB Ect Mult Living                  Review of Systems  Respiratory: Positive for cough and wheezing.   All other systems reviewed and are negative.    Allergies  Atorvastatin; Penicillins; Rosuvastatin; and Simvastatin  Home Medications   Current Outpatient Rx  Name Route Sig Dispense Refill  . ALBUTEROL SULFATE HFA 108  (90 BASE) MCG/ACT IN AERS Inhalation Inhale 2 puffs into the lungs every 4 (four) hours as needed for wheezing or shortness of breath. 2-4 puffs inhaled bid prn 1 Inhaler 2  . ALPRAZOLAM 0.5 MG PO TABS Oral Take 0.5 mg by mouth 3 (three) times daily as needed. For anxiety     . CYCLOBENZAPRINE HCL 10 MG PO TABS Oral Take 10 mg by mouth daily.      Marland Kitchen FLUOXETINE HCL 20 MG PO CAPS Oral Take 20 mg by mouth daily.      Marland Kitchen HYOSCYAMINE SULFATE 0.375 MG PO CP12 Oral Take by mouth.      . MELOXICAM 7.5 MG PO TABS Oral Take 1 tablet (7.5 mg total) by mouth daily. PRN  60 tablet 3  . OMEPRAZOLE 40 MG PO CPDR Oral Take 1 capsule (40 mg total) by mouth daily. 30 capsule 6  . POLYETHYLENE GLYCOL 3350 POWD  1 capful mixed w/ a glass of water daily 527 g 2  . POLYETHYLENE GLYCOL 3350 PO POWD  MIX 1 CAPFUL WITH A GLASS OF WATER ONCE DAILY 527 g 2  . PROPRANOLOL HCL 40 MG PO TABS  TAKE 1 TABLET TWICE A DAY 60 tablet 3  . TRAZODONE HCL 50 MG PO TABS Oral Take 1 tablet (50  mg total) by mouth at bedtime. For sleep 30 tablet 6  . BECLOMETHASONE DIPROPIONATE 40 MCG/ACT IN AERS Inhalation Inhale 2 puffs into the lungs 2 (two) times daily. 1 Inhaler 1  . BUDESONIDE-FORMOTEROL FUMARATE 80-4.5 MCG/ACT IN AERO Inhalation Inhale 2 puffs into the lungs 2 (two) times daily. 1 Inhaler 2  . CHOLINE FENOFIBRATE 135 MG PO CPDR Oral Take 1 capsule (135 mg total) by mouth daily. 30 capsule 2    BP 114/74  Pulse 75  Temp 98.2 F (36.8 C) (Oral)  Resp 16  Ht 5\' 4"  (1.626 m)  Wt 204 lb (92.534 kg)  BMI 35.02 kg/m2  SpO2 96%  Physical Exam  Constitutional:       Obese, in minimal distress   HENT:  Head: Normocephalic and atraumatic.  Right Ear: External ear normal.  Left Ear: External ear normal.  Eyes: Conjunctivae are normal. Pupils are equal, round, and reactive to light.  Neck: Normal range of motion. Neck supple.  Cardiovascular: Normal rate and regular rhythm.   Pulmonary/Chest: No respiratory distress. She  exhibits no tenderness.       Diffuse inspiratory/expiratory wheezes.    Abdominal: Soft.  Neurological: She is alert.  Skin: Skin is warm.    ED Course  Procedures (including critical care time)  Labs Reviewed - No data to display No results found.   1. URI (upper respiratory infection)   2. COPD exacerbation       MDM  Viral induced COPD exacerbation.  Solumedrol 125mg  IM x 1 Prednisone x 5 days.  Doxycycline Discussed smoking cessation at length.  Resp and infectious red flags discussed at length.  Follow up with PCP in 3-5 days.     The patient and/or caregiver has been counseled thoroughly with regard to treatment plan and/or medications prescribed including dosage, schedule, interactions, rationale for use, and possible side effects and they verbalize understanding. Diagnoses and expected course of recovery discussed and will return if not improved as expected or if the condition worsens. Patient and/or caregiver verbalized understanding.             Floydene Flock, MD 09/09/11 1655

## 2011-09-10 NOTE — ED Provider Notes (Signed)
Agree with exam, assessment, and plan.   Lattie Haw, MD 09/10/11 [phone]

## 2011-10-17 ENCOUNTER — Other Ambulatory Visit: Payer: Self-pay | Admitting: Family Medicine

## 2011-11-17 ENCOUNTER — Other Ambulatory Visit: Payer: Self-pay | Admitting: Family Medicine

## 2011-12-19 ENCOUNTER — Other Ambulatory Visit: Payer: Self-pay | Admitting: Family Medicine

## 2012-01-12 ENCOUNTER — Ambulatory Visit (INDEPENDENT_AMBULATORY_CARE_PROVIDER_SITE_OTHER): Payer: BC Managed Care – PPO | Admitting: Family Medicine

## 2012-01-12 ENCOUNTER — Encounter: Payer: Self-pay | Admitting: Family Medicine

## 2012-01-12 VITALS — BP 101/70 | HR 68 | Ht 64.0 in | Wt 200.0 lb

## 2012-01-12 DIAGNOSIS — I10 Essential (primary) hypertension: Secondary | ICD-10-CM

## 2012-01-12 DIAGNOSIS — J45909 Unspecified asthma, uncomplicated: Secondary | ICD-10-CM

## 2012-01-12 DIAGNOSIS — M542 Cervicalgia: Secondary | ICD-10-CM

## 2012-01-12 DIAGNOSIS — H9209 Otalgia, unspecified ear: Secondary | ICD-10-CM

## 2012-01-12 DIAGNOSIS — E785 Hyperlipidemia, unspecified: Secondary | ICD-10-CM

## 2012-01-12 LAB — CBC WITH DIFFERENTIAL/PLATELET
Basophils Absolute: 0.1 10*3/uL (ref 0.0–0.1)
Basophils Relative: 1 % (ref 0–1)
Eosinophils Absolute: 0.3 10*3/uL (ref 0.0–0.7)
Eosinophils Relative: 3 % (ref 0–5)
MCH: 30.1 pg (ref 26.0–34.0)
MCHC: 35.3 g/dL (ref 30.0–36.0)
MCV: 85.4 fL (ref 78.0–100.0)
Monocytes Absolute: 0.8 10*3/uL (ref 0.1–1.0)
Platelets: 356 10*3/uL (ref 150–400)
RDW: 14 % (ref 11.5–15.5)
WBC: 9.7 10*3/uL (ref 4.0–10.5)

## 2012-01-12 LAB — LIPID PANEL: Triglycerides: 500 mg/dL — ABNORMAL HIGH (ref <150)

## 2012-01-12 LAB — COMPLETE METABOLIC PANEL WITH GFR
ALT: 19 U/L (ref 0–35)
AST: 18 U/L (ref 0–37)
Albumin: 4.1 g/dL (ref 3.5–5.2)
CO2: 28 mEq/L (ref 19–32)
Calcium: 9.5 mg/dL (ref 8.4–10.5)
Chloride: 103 mEq/L (ref 96–112)
GFR, Est African American: 70 mL/min
Potassium: 4.7 mEq/L (ref 3.5–5.3)
Sodium: 138 mEq/L (ref 135–145)
Total Protein: 6.8 g/dL (ref 6.0–8.3)

## 2012-01-12 NOTE — Progress Notes (Signed)
  Subjective:    Patient ID: Angenetta Pigg, female    DOB: Feb 21, 1961, 51 y.o.   MRN: [id number]  HPI 3 days of right ear pain radiating into the neck.  + sneezing. No fever. No other URI sxs. No dysphagia.  Had flu shot at [facility] in Sept.    Hypertension-no chest pain or short of breath. Taking medications related.  Hyperlipidemia-no myalgias or other side effects of medications. She says she's taking it really. Due to check today.   Review of Systems     Objective:   Physical Exam  Constitutional: She is oriented to person, place, and time. She appears well-developed and well-nourished.  HENT:  Head: Normocephalic and atraumatic.  Right Ear: External ear normal.  Left Ear: External ear normal.  Nose: Nose normal.  Mouth/Throat: Oropharynx is clear and moist.       TMs and canals are clear.   Eyes: Conjunctivae normal and EOM are normal. Pupils are equal, round, and reactive to light.  Neck: Neck supple. No thyromegaly present.       Tender right ant cerv LN  Cardiovascular: Normal rate, regular rhythm and normal heart sounds.   Pulmonary/Chest: Effort normal. She has wheezes.       +diffuse wheezing  Lymphadenopathy:    She has cervical adenopathy.  Neurological: She is alert and oriented to person, place, and time.  Skin: Skin is warm and dry.  Psychiatric: She has a normal mood and affect.          Assessment & Plan:  Right otalgia - no sign of infection or otitis media or externa. She has had some sneezing for certainly this could be allergic and she could have a blood pressure. Recommend trial of over-the-counter Claritin. She also has some periodontal disease and does have some redness around one of her lower molars on the right lower jaw. If the Claritin is not helpful for the next week or so then recommend that she go ahead and schedule earlier appointment with with her dentist.  HTN-very well controlled current regimen. Due for CMP and fasting lipid  panel. We'll check these today. Followup in 6 months for repeat blood pressure check at that time.  Hyperlipidemia-doing well on current regimen. Check lipids today. Check liver enzymes as well.  Asthma-she's wheezing on exam today. She says she does not use her inhalers regularly only when needed. Though she says recently she's been wheezing a lot. I encouraged her to get back on her Symbicort regularly and use her albuterol as needed when she's wheezing. I think patient was using it sparingly to save money.

## 2012-01-21 ENCOUNTER — Other Ambulatory Visit: Payer: Self-pay | Admitting: Family Medicine

## 2012-01-23 ENCOUNTER — Other Ambulatory Visit: Payer: Self-pay | Admitting: *Deleted

## 2012-01-24 ENCOUNTER — Other Ambulatory Visit: Payer: Self-pay | Admitting: *Deleted

## 2012-01-24 MED ORDER — PROPRANOLOL HCL 40 MG PO TABS: 40.0000 mg | ORAL_TABLET | Freq: Two times a day (BID) | ORAL | Status: DC

## 2012-04-14 ENCOUNTER — Other Ambulatory Visit: Payer: Self-pay | Admitting: Family Medicine

## 2012-04-17 ENCOUNTER — Encounter: Payer: Self-pay | Admitting: *Deleted

## 2012-04-17 ENCOUNTER — Emergency Department (INDEPENDENT_AMBULATORY_CARE_PROVIDER_SITE_OTHER)
Admission: EM | Admit: 2012-04-17 | Discharge: 2012-04-17 | Disposition: A | Discharge status: Home / Self Care | Payer: 59 | Source: Home / Self Care

## 2012-04-17 DIAGNOSIS — J209 Acute bronchitis, unspecified: Secondary | ICD-10-CM

## 2012-04-17 MED ORDER — ALBUTEROL SULFATE HFA 108 (90 BASE) MCG/ACT IN AERS: 2.0000 | INHALATION_SPRAY | RESPIRATORY_TRACT | Status: DC | PRN

## 2012-04-17 MED ORDER — PREDNISONE 20 MG PO TABS: 20.0000 mg | ORAL_TABLET | Freq: Two times a day (BID) | ORAL | Status: DC

## 2012-04-17 MED ORDER — DOXYCYCLINE HYCLATE 100 MG PO CAPS: 100.0000 mg | ORAL_CAPSULE | Freq: Two times a day (BID) | ORAL | Status: DC

## 2012-04-17 NOTE — ED Notes (Signed)
Vickie Carter c/o productive cough x 3 weeks. Denies fever. No otc med taken

## 2012-04-17 NOTE — ED Provider Notes (Signed)
History     CSN: [id number]  Arrival date & time 04/17/12  1208   None     Chief Complaint  Patient presents with  . Cough       HPI Comments: Patient developed a mild productive cough about 3 weeks ago that has become worse over the past several days.  About 3 days ago she developed tightness in her anterior chest, and has been fatigued today.  No fevers, chills, and sweats.  She complains of shortness of breath and occasional wheezing.  She has an old albuterol inhaler and has used it during the past several days.  She continues to smoke.  The history is provided by the patient.    Past Medical History  Diagnosis Date  . Reflux   . Migraines   . Asthma   . History of drug overdose     trazadone    Past Surgical History  Procedure Laterality Date  . Back surgery  2003    Family History  Problem Relation Age of Onset  . Depression Sister   . Alcohol abuse Brother     History  Substance Use Topics  . Smoking status: Current Every Day Smoker -- 0.50 packs/day for 35 years    Types: Cigarettes  . Smokeless tobacco: Never Used  . Alcohol Use: No    OB History   Grav Para Term Preterm Abortions TAB SAB Ect Mult Living                  Review of Systems No sore throat + cough No pleuritic pain, but has had anterior chest tightness + wheezing + slight nasal congestion No post-nasal drainage No sinus pain/pressure No itchy/red eyes No earache No hemoptysis + SOB No fever/chills No nausea No vomiting No abdominal pain No diarrhea No urinary symptoms No skin rashes + fatigue + myalgias No headache Used OTC meds without relief  Allergies  Atorvastatin; Penicillins; Rosuvastatin; and Simvastatin  Home Medications   Current Outpatient Rx  Name  Route  Sig  Dispense  Refill  . albuterol (VENTOLIN HFA) 108 (90 BASE) MCG/ACT inhaler   Inhalation   Inhale 2 puffs into the lungs every 4 (four) hours as needed for wheezing or shortness of breath.   1  Inhaler   2   . ALPRAZolam (XANAX) 1 MG tablet   Oral   Take 1 mg by mouth daily.         . budesonide-formoterol (SYMBICORT) 80-4.5 MCG/ACT inhaler   Inhalation   Inhale 2 puffs into the lungs 2 (two) times daily.         . Choline Fenofibrate 135 MG capsule   Oral   Take 135 mg by mouth daily.         Marland Kitchen dexlansoprazole (DEXILANT) 60 MG capsule   Oral   Take 60 mg by mouth daily.         Marland Kitchen doxycycline (VIBRAMYCIN) 100 MG capsule   Oral   Take 1 capsule (100 mg total) by mouth 2 (two) times daily.   20 capsule   0   . FLUoxetine (PROZAC) 20 MG capsule   Oral   Take 20 mg by mouth daily.           . polyethylene glycol powder (GLYCOLAX/MIRALAX) powder      MIX 1 CAPFUL WITH A GLASS OF WATER ONCE DAILY   527 g   1   . predniSONE (DELTASONE) 20 MG tablet   Oral  Take 1 tablet (20 mg total) by mouth 2 (two) times daily. Take with food.   10 tablet   0   . propranolol (INDERAL) 40 MG tablet      TAKE 1 TABLET TWICE A DAY   60 tablet   1   . sucralfate (CARAFATE) 1 G tablet   Oral   Take 1 g by mouth 4 (four) times daily.         . traZODone (DESYREL) 50 MG tablet   Oral   Take 1 tablet (50 mg total) by mouth at bedtime. For sleep   30 tablet   6     BP 112/76  Pulse 68  Temp(Src) 97.5 F (36.4 C) (Oral)  Ht 5\' 4"  (1.626 m)  Wt 197 lb (89.359 kg)  BMI 33.8 kg/m2  SpO2 96%  Physical Exam Nursing notes and Vital Signs reviewed. Appearance:  Patient appears stated age, and in no acute distress.  Patient is obese (BMI 33.8) Eyes:  Pupils are equal, round, and reactive to light and accomodation.  Extraocular movement is intact.  Conjunctivae are not inflamed  Ears:  Canals normal.  Tympanic membranes normal.  Nose:  Mildly congested turbinates.  No sinus tenderness.   Pharynx:  Normal Neck:  Supple.  Slightly tender shotty posterior nodes are palpated bilaterally  Lungs:  Clear to auscultation.  Breath sounds are equal.  Chest:  Distinct  tenderness to palpation over the mid-sternum.  Heart:  Regular rate and rhythm without murmurs, rubs, or gallops.  Abdomen:  Nontender without masses or hepatosplenomegaly.  Bowel sounds are present.  No CVA or flank tenderness.  Extremities:  No edema.  No calf tenderness Skin:  No rash present.   ED Course  Procedures  none      1. Acute bronchitis in a smoker; suspect early developing viral URI       MDM  Begin doxycycline and prednisone burst. Take plain Mucinex (guaifenesin) twice daily for cough and congestion.  Increase fluid intake, rest. May use Afrin nasal spray (or generic oxymetazoline) twice daily for about 5 days.  Also recommend using saline nasal spray several times daily and saline nasal irrigation (AYR is a common brand) Stop all antihistamines for now, and other non-prescription cough/cold preparations. Continue all inhalers Follow-up with family doctor if not improving one week.        Lattie Haw, MD 04/18/12 [phone]

## 2012-06-15 ENCOUNTER — Other Ambulatory Visit: Payer: Self-pay | Admitting: Family Medicine

## 2012-07-12 ENCOUNTER — Encounter: Payer: Self-pay | Admitting: Family Medicine

## 2012-07-12 ENCOUNTER — Ambulatory Visit (INDEPENDENT_AMBULATORY_CARE_PROVIDER_SITE_OTHER): Payer: 59 | Admitting: Family Medicine

## 2012-07-12 VITALS — BP 104/71 | HR 68 | Resp 16 | Wt 198.0 lb

## 2012-07-12 DIAGNOSIS — E785 Hyperlipidemia, unspecified: Secondary | ICD-10-CM

## 2012-07-12 DIAGNOSIS — R1110 Vomiting, unspecified: Secondary | ICD-10-CM

## 2012-07-12 DIAGNOSIS — I10 Essential (primary) hypertension: Secondary | ICD-10-CM

## 2012-07-12 DIAGNOSIS — J45909 Unspecified asthma, uncomplicated: Secondary | ICD-10-CM

## 2012-07-12 NOTE — Progress Notes (Signed)
  Subjective:    Patient ID: Vickie Carter, female    DOB: December 18, 1960, 52 y.o.   MRN: [id number]  HPI Followed by Dr. Yevonne Pax for her GI problems and vomiting. Says doesn't have the money to check her gallbladder.    HTN -  Pt denies chest pain, SOB, dizziness, or heart palpitations.  Taking meds as directed w/o problems.  Denies medication side effects.   Asthma - Has been uhaving to use her inahler more when nervous or upset. Will use it 3 times a day. Say when out and about she is less stressed and doesn't think about smoking and doesn't have use her inhaler.    Review of Systems     Objective:   Physical Exam  Constitutional: She is oriented to person, place, and time. She appears well-developed and well-nourished.  HENT:  Head: Normocephalic and atraumatic.  Cardiovascular: Normal rate, regular rhythm and normal heart sounds.   Pulmonary/Chest: Effort normal and breath sounds normal.  Neurological: She is alert and oriented to person, place, and time.  Skin: Skin is warm and dry.  Psychiatric: She has a normal mood and affect. Her behavior is normal.          Assessment & Plan:  HTN - well controlled. Continue current regimen.  Asthma - Peak flows in the yellow zone today. I wonder how accurate the films are for her since she has a history of asthma she is now 52 years old and is a half a pack a day smoker. I suspect that she probably has some element of COPD that may actually affect was owns are normal for her. When she is feeling better would like to see her back and perform spirometry. I did give her peak flow meter and set up for her and gave her instructions on how to properly use it. I do think she's having some difficulty telling if her shortness of breath is anxiety related versus actually her asthma. Hopefully this will be helpful and we can better judge whether not she needs to be on something prophylactically such as inhaled corticosteroid.  Recurrent vomiting -  being followed by GI. I did give her a work note for today.  Hyperlipidemia-due to check a direct LDL. Unfortunately was never added on to her labs back in October.

## 2012-07-12 NOTE — Patient Instructions (Addendum)
Track your peak flows especially on her good days. Bring this in with you when you come back in so that we can look at those and help reset your meter and make it more accurate for you personally. If you're in the yellow zone please give 2 puffs. If you're in the red zone please to 4 puffs and then recheck your peak flow in 30 minutes to one hour.    Smoking Cessation, Tips for Success YOU CAN QUIT SMOKING If you are ready to quit smoking, congratulations! You have chosen to help yourself be healthier. Cigarettes bring nicotine, tar, carbon monoxide, and other irritants into your body. Your lungs, heart, and blood vessels will be able to work better without these poisons. There are many different ways to quit smoking. Nicotine gum, nicotine patches, a nicotine inhaler, or nicotine nasal spray can help with physical craving. Hypnosis, support groups, and medicines help break the habit of smoking. Here are some tips to help you quit for good.  Throw away all cigarettes.  Clean and remove all ashtrays from your home, work, and car.  On a card, write down your reasons for quitting. Carry the card with you and read it when you get the urge to smoke.  Cleanse your body of nicotine. Drink enough water and fluids to keep your urine clear or pale yellow. Do this after quitting to flush the nicotine from your body.  Learn to predict your moods. Do not let a bad situation be your excuse to have a cigarette. Some situations in your life might tempt you into wanting a cigarette.  Never have "just one" cigarette. It leads to wanting another and another. Remind yourself of your decision to quit.  Change habits associated with smoking. If you smoked while driving or when feeling stressed, try other activities to replace smoking. Stand up when drinking your coffee. Brush your teeth after eating. Sit in a different chair when you read the paper. Avoid alcohol while trying to quit, and try to drink fewer  caffeinated beverages. Alcohol and caffeine may urge you to smoke.  Avoid foods and drinks that can trigger a desire to smoke, such as sugary or spicy foods and alcohol.  Ask people who smoke not to smoke around you.  Have something planned to do right after eating or having a cup of coffee. Take a walk or exercise to perk you up. This will help to keep you from overeating.  Try a relaxation exercise to calm you down and decrease your stress. Remember, you may be tense and nervous for the first 2 weeks after you quit, but this will pass.  Find new activities to keep your hands busy. Play with a pen, coin, or rubber band. Doodle or draw things on paper.  Brush your teeth right after eating. This will help cut down on the craving for the taste of tobacco after meals. You can try mouthwash, too.  Use oral substitutes, such as lemon drops, carrots, a cinnamon stick, or chewing gum, in place of cigarettes. Keep them handy so they are available when you have the urge to smoke.  When you have the urge to smoke, try deep breathing.  Designate your home as a nonsmoking area.  If you are a heavy smoker, ask your caregiver about a prescription for nicotine chewing gum. It can ease your withdrawal from nicotine.  Reward yourself. Set aside the cigarette money you save and buy yourself something nice.  Look for support from others. Join  a support group or smoking cessation program. Ask someone at home or at work to help you with your plan to quit smoking.  Always ask yourself, "Do I need this cigarette or is this just a reflex?" Tell yourself, "Today, I choose not to smoke," or "I do not want to smoke." You are reminding yourself of your decision to quit, even if you do smoke a cigarette. HOW WILL I FEEL WHEN I QUIT SMOKING?  The benefits of not smoking start within days of quitting.  You may have symptoms of withdrawal because your body is used to nicotine (the addictive substance in cigarettes).  You may crave cigarettes, be irritable, feel very hungry, cough often, get headaches, or have difficulty concentrating.  The withdrawal symptoms are only temporary. They are strongest when you first quit but will go away within 10 to 14 days.  When withdrawal symptoms occur, stay in control. Think about your reasons for quitting. Remind yourself that these are signs that your body is healing and getting used to being without cigarettes.  Remember that withdrawal symptoms are easier to treat than the major diseases that smoking can cause.  Even after the withdrawal is over, expect periodic urges to smoke. However, these cravings are generally short-lived and will go away whether you smoke or not. Do not smoke!  If you relapse and smoke again, do not lose hope. Most smokers quit 3 times before they are successful.  If you relapse, do not give up! Plan ahead and think about what you will do the next time you get the urge to smoke. LIFE AS A NONSMOKER: MAKE IT FOR A MONTH, MAKE IT FOR LIFE Day 1: Hang this page where you will see it every day. Day 2: Get rid of all ashtrays, matches, and lighters. Day 3: Drink water. Breathe deeply between sips. Day 4: Avoid places with smoke-filled air, such as bars, clubs, or the smoking section of restaurants. Day 5: Keep track of how much money you save by not smoking. Day 6: Avoid boredom. Keep a good book with you or go to the movies. Day 7: Reward yourself! One week without smoking! Day 8: Make a dental appointment to get your teeth cleaned. Day 9: Decide how you will turn down a cigarette before it is offered to you. Day 10: Review your reasons for quitting. Day 11: Distract yourself. Stay active to keep your mind off smoking and to relieve tension. Take a walk, exercise, read a book, do a crossword puzzle, or try a new hobby. Day 12: Exercise. Get off the bus before your stop or use stairs instead of escalators. Day 13: Call on friends for support and  encouragement. Day 14: Reward yourself! Two weeks without smoking! Day 15: Practice deep breathing exercises. Day 16: Bet a friend that you can stay a nonsmoker. Day 17: Ask to sit in nonsmoking sections of restaurants. Day 18: Hang up "No Smoking" signs. Day 19: Think of yourself as a nonsmoker. Day 20: Each morning, tell yourself you will not smoke. Day 21: Reward yourself! Three weeks without smoking! Day 22: Think of smoking in negative ways. Remember how it stains your teeth, gives you bad breath, and leaves you short of breath. Day 23: Eat a nutritious breakfast. Day 24:Do not relive your days as a smoker. Day 25: Hold a pencil in your hand when talking on the telephone. Day 26: Tell all your friends you do not smoke. Day 27: Think about how much better food tastes.  Day 28: Remember, one cigarette is one too many. Day 29: Take up a hobby that will keep your hands busy. Day 30: Congratulations! One month without smoking! Give yourself a big reward. Your caregiver can direct you to community resources or hospitals for support, which may include:  Group support.  Education.  Hypnosis.  Subliminal therapy. Document Released: 11/06/2003 Document Revised: 05/02/2011 Document Reviewed: 11/24/2008 [facility] Patient Information 2014 [place], [place].

## 2012-07-25 ENCOUNTER — Emergency Department
Admission: EM | Admit: 2012-07-25 | Discharge: 2012-07-25 | Disposition: A | Discharge status: Home Health | Payer: 59 | Source: Home / Self Care | Attending: Family Medicine | Admitting: Family Medicine

## 2012-07-25 ENCOUNTER — Encounter: Payer: Self-pay | Admitting: Emergency Medicine

## 2012-07-25 DIAGNOSIS — R110 Nausea: Secondary | ICD-10-CM

## 2012-07-25 NOTE — ED Provider Notes (Signed)
History     CSN: [id number]  Arrival date & time 07/25/12  1191   First MD Initiated Contact with Patient 07/25/12 1028      Chief Complaint  Patient presents with  . Nausea       HPI Comments: Patient left without being evaluated.   Past Medical History  Diagnosis Date  . Reflux   . Migraines   . Asthma   . History of drug overdose     trazadone    Past Surgical History  Procedure Laterality Date  . Back surgery  2003    Family History  Problem Relation Age of Onset  . Depression Sister   . Alcohol abuse Brother     History  Substance Use Topics  . Smoking status: Current Every Day Smoker -- 1.00 packs/day for 45 years    Types: Cigarettes  . Smokeless tobacco: Never Used  . Alcohol Use: No    OB History   Grav Para Term Preterm Abortions TAB SAB Ect Mult Living                  Review of Systems Patient not evaluated  Allergies  Atorvastatin; Penicillins; Rosuvastatin; and Simvastatin  Home Medications   Current Outpatient Rx  Name  Route  Sig  Dispense  Refill  . albuterol (VENTOLIN HFA) 108 (90 BASE) MCG/ACT inhaler   Inhalation   Inhale 2 puffs into the lungs every 4 (four) hours as needed for wheezing or shortness of breath.   1 Inhaler   2   . ALPRAZolam (XANAX) 1 MG tablet   Oral   Take 1 mg by mouth daily.         . budesonide-formoterol (SYMBICORT) 80-4.5 MCG/ACT inhaler   Inhalation   Inhale 2 puffs into the lungs 2 (two) times daily.         . Choline Fenofibrate 135 MG capsule   Oral   Take 135 mg by mouth daily.         Marland Kitchen dexlansoprazole (DEXILANT) 60 MG capsule   Oral   Take 60 mg by mouth daily.         Marland Kitchen doxycycline (VIBRAMYCIN) 100 MG capsule   Oral   Take 1 capsule (100 mg total) by mouth 2 (two) times daily.   20 capsule   0   . FLUoxetine (PROZAC) 20 MG capsule   Oral   Take 20 mg by mouth daily.           . polyethylene glycol powder (GLYCOLAX/MIRALAX) powder      MIX 1 CAPFUL WITH A GLASS OF  WATER ONCE DAILY   527 g   1   . predniSONE (DELTASONE) 20 MG tablet   Oral   Take 1 tablet (20 mg total) by mouth 2 (two) times daily. Take with food.   10 tablet   0   . propranolol (INDERAL) 40 MG tablet      TAKE 1 TABLET TWICE A DAY   60 tablet   1   . sucralfate (CARAFATE) 1 G tablet   Oral   Take 1 g by mouth 4 (four) times daily.         . traZODone (DESYREL) 50 MG tablet   Oral   Take 1 tablet (50 mg total) by mouth at bedtime. For sleep   30 tablet   6     BP 119/78  Pulse 67  Temp(Src) 98.7 F (37.1 C) (Oral)  Ht 5\' 4"  (  1.626 m)  Wt 201 lb (91.173 kg)  BMI 34.48 kg/m2  SpO2 95%  Physical Exam Patient not examined ED Course  Procedures (including critical care time)  Labs Reviewed - No data to display No results found.   1. Nausea alone       MDM  Patient left without being evaluated        Lattie Haw, MD 08/01/12 [phone]

## 2012-07-25 NOTE — ED Notes (Signed)
Patient complains of nausea for 2 days. She sees a Tree surgeon and I asked her she had called his office she said "no, he can't do nothing and neither can yall, all I need is a doctor's note for work, are yall gonna give me one or not" I don't won't no more medicine or tests, he has done everything and cant find nothing wrong with me, I just want a doctors note.

## 2012-08-02 ENCOUNTER — Ambulatory Visit: Payer: 59 | Admitting: Family Medicine

## 2012-09-17 ENCOUNTER — Other Ambulatory Visit: Payer: Self-pay | Admitting: Family Medicine

## 2012-11-21 ENCOUNTER — Encounter: Payer: Self-pay | Admitting: Physician Assistant

## 2012-11-21 ENCOUNTER — Ambulatory Visit (INDEPENDENT_AMBULATORY_CARE_PROVIDER_SITE_OTHER): Payer: 59 | Admitting: Physician Assistant

## 2012-11-21 VITALS — BP 160/86 | HR 105 | Wt 200.0 lb

## 2012-11-21 DIAGNOSIS — H6692 Otitis media, unspecified, left ear: Secondary | ICD-10-CM

## 2012-11-21 DIAGNOSIS — H6690 Otitis media, unspecified, unspecified ear: Secondary | ICD-10-CM

## 2012-11-21 DIAGNOSIS — I10 Essential (primary) hypertension: Secondary | ICD-10-CM

## 2012-11-21 DIAGNOSIS — J019 Acute sinusitis, unspecified: Secondary | ICD-10-CM

## 2012-11-21 MED ORDER — BACLOFEN 20 MG PO TABS: 20.0000 mg | ORAL_TABLET | Freq: Three times a day (TID) | ORAL | Status: DC

## 2012-11-21 MED ORDER — AZITHROMYCIN 250 MG PO TABS: ORAL_TABLET | ORAL | Status: DC

## 2012-11-21 MED ORDER — LISINOPRIL-HYDROCHLOROTHIAZIDE 20-12.5 MG PO TABS: 1.0000 | ORAL_TABLET | Freq: Every day | ORAL | Status: DC

## 2012-11-21 MED ORDER — PROPRANOLOL HCL 40 MG PO TABS: 40.0000 mg | ORAL_TABLET | Freq: Two times a day (BID) | ORAL | Status: DC

## 2012-11-21 NOTE — Progress Notes (Signed)
  Subjective:    Patient ID: Vickie Carter, female    DOB: 15-May-1960, 52 y.o.   MRN: [id number]  HPI Patient is a 52 yo female who presents to the clinic with ear pain and sinus pressure. She has been battling a cold like symptoms for a couple of weeks but last night her ear pain and sinus pressure got worse. She denies any fever or chills. She has had an ongoing cough that is sometimes productive. Facial pain is worse on the left side of face. Denies any nausea/vomiting/diarrhea. She has tried OTC cough syrup, mucinex and not felt like helping very much.   In august pt got fired and took a whole bottle of xanax. She was found with a BP of 76/36. She admitted she wanted to die. She is seeing a psychiatrist now along with counseling. She visits him monthly. She has good friends to help her.    Not been on bp medications before. Not been taking blood pressure. Denies any CP, palpitations, SOB, HA's, or vision changes.     Review of Systems     Objective:   Physical Exam  Constitutional: She is oriented to person, place, and time. She appears well-developed and well-nourished.  Obese.   HENT:  Head: Normocephalic and atraumatic.  Right Ear: External ear normal.  TM of right clear.  TM of left ear erythematous, dull buldging. Ossicles not able to be viewed.   Maxillary and frontal sinuses tender to palpation.   Oropharynx erythematous without tonsil enlargement or exudate.     Eyes: Conjunctivae are normal. Right eye exhibits no discharge. Left eye exhibits no discharge.  Neck: Normal range of motion. Neck supple.  Tender bilateral cervical adenopathy.   Cardiovascular: Normal rate, regular rhythm and normal heart sounds.   Pulmonary/Chest: Effort normal and breath sounds normal. She has no wheezes.  Lymphadenopathy:    She has cervical adenopathy.  Neurological: She is alert and oriented to person, place, and time.  Skin: Skin is warm and dry.  Psychiatric: She has a normal mood  and affect. Her behavior is normal.          Assessment & Plan:  Drug overdose-Pt is currently being managed by psychiatry.Pt is not taking xanax.   Left otitis media/sinusitis- Zpak given due to PCN allergy. Discussed symptomatic care. Call if not improving or suddenly worsening.   HTN- Started lisinopril/HCTZ daily. Follow up in 4 weeks.

## 2012-11-21 NOTE — Patient Instructions (Addendum)
Start lisinopril/HCTZ daily.   Sinusitis Sinusitis is redness, soreness, and swelling (inflammation) of the paranasal sinuses. Paranasal sinuses are air pockets within the bones of your face (beneath the eyes, the middle of the forehead, or above the eyes). In healthy paranasal sinuses, mucus is able to drain out, and air is able to circulate through them by way of your nose. However, when your paranasal sinuses are inflamed, mucus and air can become trapped. This can allow bacteria and other germs to grow and cause infection. Sinusitis can develop quickly and last only a short time (acute) or continue over a long period (chronic). Sinusitis that lasts for more than 12 weeks is considered chronic.  CAUSES  Causes of sinusitis include:  Allergies.  Structural abnormalities, such as displacement of the cartilage that separates your nostrils (deviated septum), which can decrease the air flow through your nose and sinuses and affect sinus drainage.  Functional abnormalities, such as when the small hairs (cilia) that line your sinuses and help remove mucus do not work properly or are not present. SYMPTOMS  Symptoms of acute and chronic sinusitis are the same. The primary symptoms are pain and pressure around the affected sinuses. Other symptoms include:  Upper toothache.  Earache.  Headache.  Bad breath.  Decreased sense of smell and taste.  A cough, which worsens when you are lying flat.  Fatigue.  Fever.  Thick drainage from your nose, which often is green and may contain pus (purulent).  Swelling and warmth over the affected sinuses. DIAGNOSIS  Your caregiver will perform a physical exam. During the exam, your caregiver may:  Look in your nose for signs of abnormal growths in your nostrils (nasal polyps).  Tap over the affected sinus to check for signs of infection.  View the inside of your sinuses (endoscopy) with a special imaging device with a light attached (endoscope),  which is inserted into your sinuses. If your caregiver suspects that you have chronic sinusitis, one or more of the following tests may be recommended:  Allergy tests.  Nasal culture A sample of mucus is taken from your nose and sent to a lab and screened for bacteria.  Nasal cytology A sample of mucus is taken from your nose and examined by your caregiver to determine if your sinusitis is related to an allergy. TREATMENT  Most cases of acute sinusitis are related to a viral infection and will resolve on their own within 10 days. Sometimes medicines are prescribed to help relieve symptoms (pain medicine, decongestants, nasal steroid sprays, or saline sprays).  However, for sinusitis related to a bacterial infection, your caregiver will prescribe antibiotic medicines. These are medicines that will help kill the bacteria causing the infection.  Rarely, sinusitis is caused by a fungal infection. In theses cases, your caregiver will prescribe antifungal medicine. For some cases of chronic sinusitis, surgery is needed. Generally, these are cases in which sinusitis recurs more than 3 times per year, despite other treatments. HOME CARE INSTRUCTIONS   Drink plenty of water. Water helps thin the mucus so your sinuses can drain more easily.  Use a humidifier.  Inhale steam 3 to 4 times a day (for example, sit in the bathroom with the shower running).  Apply a warm, moist washcloth to your face 3 to 4 times a day, or as directed by your caregiver.  Use saline nasal sprays to help moisten and clean your sinuses.  Take over-the-counter or prescription medicines for pain, discomfort, or fever only as directed by  your caregiver. SEEK IMMEDIATE MEDICAL CARE IF:  You have increasing pain or severe headaches.  You have nausea, vomiting, or drowsiness.  You have swelling around your face.  You have vision problems.  You have a stiff neck.  You have difficulty breathing. MAKE SURE YOU:    Understand these instructions.  Will watch your condition.  Will get help right away if you are not doing well or get worse. Document Released: 02/07/2005 Document Revised: 05/02/2011 Document Reviewed: 02/22/2011 [facility] Patient Information 2014 [place], [place].

## 2012-11-23 DIAGNOSIS — T50901A Poisoning by unspecified drugs, medicaments and biological substances, accidental (unintentional), initial encounter: Secondary | ICD-10-CM | POA: Insufficient documentation

## 2012-11-26 ENCOUNTER — Telehealth: Payer: Self-pay

## 2012-11-26 ENCOUNTER — Other Ambulatory Visit: Payer: Self-pay | Admitting: Physician Assistant

## 2012-11-26 MED ORDER — FLUTICASONE PROPIONATE 50 MCG/ACT NA SUSP: 1.0000 | Freq: Every day | NASAL | Status: DC

## 2012-11-26 NOTE — Telephone Encounter (Signed)
Left detailed message.   

## 2012-11-26 NOTE — Telephone Encounter (Signed)
I will send over nasal spray to along for drainage and could try decongestant.

## 2012-11-26 NOTE — Telephone Encounter (Signed)
Right ear is still stopped up. What do you recommend for her ear?   [facility] [place] PCN allergy

## 2013-10-18 ENCOUNTER — Encounter: Payer: Self-pay | Admitting: Family Medicine

## 2013-10-18 ENCOUNTER — Ambulatory Visit (INDEPENDENT_AMBULATORY_CARE_PROVIDER_SITE_OTHER): Payer: Self-pay | Admitting: Family Medicine

## 2013-10-18 VITALS — BP 98/54 | HR 75 | Ht 64.0 in | Wt 211.0 lb

## 2013-10-18 DIAGNOSIS — R319 Hematuria, unspecified: Secondary | ICD-10-CM

## 2013-10-18 DIAGNOSIS — F39 Unspecified mood [affective] disorder: Secondary | ICD-10-CM

## 2013-10-18 DIAGNOSIS — N390 Urinary tract infection, site not specified: Secondary | ICD-10-CM

## 2013-10-18 DIAGNOSIS — J45909 Unspecified asthma, uncomplicated: Secondary | ICD-10-CM

## 2013-10-18 DIAGNOSIS — I10 Essential (primary) hypertension: Secondary | ICD-10-CM | POA: Insufficient documentation

## 2013-10-18 DIAGNOSIS — E785 Hyperlipidemia, unspecified: Secondary | ICD-10-CM

## 2013-10-18 DIAGNOSIS — Z23 Encounter for immunization: Secondary | ICD-10-CM

## 2013-10-18 DIAGNOSIS — R300 Dysuria: Secondary | ICD-10-CM

## 2013-10-18 LAB — POCT URINALYSIS DIPSTICK
BILIRUBIN UA: NEGATIVE
Glucose, UA: NEGATIVE
KETONES UA: NEGATIVE
Leukocytes, UA: NEGATIVE
Nitrite, UA: NEGATIVE
PH UA: 5
Protein, UA: NEGATIVE
SPEC GRAV UA: 1.02
Urobilinogen, UA: 0.2

## 2013-10-18 MED ORDER — PREDNISONE 20 MG PO TABS: 40.0000 mg | ORAL_TABLET | Freq: Every day | ORAL | Status: DC

## 2013-10-18 MED ORDER — ALBUTEROL SULFATE (2.5 MG/3ML) 0.083% IN NEBU
2.5000 mg | INHALATION_SOLUTION | Freq: Once | RESPIRATORY_TRACT | Status: AC
  Administered 2013-10-18: 2.5 mg via RESPIRATORY_TRACT

## 2013-10-18 MED ORDER — LISINOPRIL 10 MG PO TABS: 10.0000 mg | ORAL_TABLET | Freq: Every day | ORAL | Status: DC

## 2013-10-18 MED ORDER — CIPROFLOXACIN HCL 500 MG PO TABS: 500.0000 mg | ORAL_TABLET | Freq: Two times a day (BID) | ORAL | Status: AC

## 2013-10-18 NOTE — Patient Instructions (Signed)
Use your albuterol 2-4 puffs 4 times a day for the next few days as you are breathing better.

## 2013-10-18 NOTE — Progress Notes (Signed)
   Subjective:    Patient ID: Vickie Carter, female    DOB: Dec 14, 1960, 53 y.o.   MRN: [id number]  Hypertension   Hypertension- Pt denies chest pain, SOB, dizziness, or heart palpitations.  Taking meds as directed w/o problems.  Denies medication side effects.    Asthma -has been wheezing some.  She denies any changes in her environment that has had more symptoms than usual. She has not been to the emergency department for her breathing. She says she has had daytime and nighttime symptoms. She has had to use her rescue inhaler about 10 times in the last 2 weeks.   She did try to suicide and was hospitalized him as to year ago. She's been following with Dr. Gaynell Face since then. She says that she is now on disability.  She's also been having some urinary symptoms for the past couple of months. She says she's been having incontinence at night. Some low back pressure that is bilateral. And some frequency and dysuria. No hematuria. No fevers chills or sweats. Review of Systems     Objective:   Physical Exam  Constitutional: She is oriented to person, place, and time. She appears well-developed and well-nourished.  HENT:  Head: Normocephalic and atraumatic.  Cardiovascular: Normal rate, regular rhythm and normal heart sounds.   Pulmonary/Chest: Effort normal. She has wheezes.  Inspiratory wheezing.   Neurological: She is alert and oriented to person, place, and time.  Skin: Skin is warm and dry.  Psychiatric: She has a normal mood and affect. Her behavior is normal.          Assessment & Plan:  Flu vaccine given today.  UTI-will go ahead and place her on antibiotic. Urinalysis was only positive for small amount of blood. We will send for culture for confirmation. But when start her on Cipro.

## 2013-10-18 NOTE — Assessment & Plan Note (Signed)
Unclear of acutal dx.  Followed by Dr. Gaynell Face. She has been seeing him regularly/.

## 2013-10-18 NOTE — Assessment & Plan Note (Addendum)
Uncontrolled. Wheezing on exam today. Peak flows in the yellow zone. Given nebulizer treatment here in the office today. She did improve but not completely back to the green zone. She is using her Symbicort regularly. Encouraged her to be more aggressive with her albuterol use. 2-4 puffs 4 times a day as needed until she started to feel better and then can slowly wean this down. I did give her 5 days of prednisone.

## 2013-10-18 NOTE — Assessment & Plan Note (Signed)
Blood pressures actually little bit low today. We'll discontinue lisinopril HCTZ and just put her on 10 mg of lisinopril. Follow back up in about 6 weeks to make sure blood pressures looking well but better. Make sure staying well hydrated. She's also on propranolol and we may need to consider adjusting her dose if her blood pressure still low I see her back.

## 2013-10-18 NOTE — Assessment & Plan Note (Signed)
Due for CMP and lipid panel. Lab slip provided today.

## 2013-10-20 LAB — URINE CULTURE: Colony Count: 5000

## 2013-12-20 ENCOUNTER — Encounter: Payer: Self-pay | Admitting: Family Medicine

## 2013-12-20 ENCOUNTER — Ambulatory Visit (INDEPENDENT_AMBULATORY_CARE_PROVIDER_SITE_OTHER): Payer: Self-pay | Admitting: Family Medicine

## 2013-12-20 VITALS — BP 102/64 | HR 93 | Wt 213.0 lb

## 2013-12-20 DIAGNOSIS — J410 Simple chronic bronchitis: Secondary | ICD-10-CM

## 2013-12-20 DIAGNOSIS — J4541 Moderate persistent asthma with (acute) exacerbation: Secondary | ICD-10-CM

## 2013-12-20 DIAGNOSIS — I10 Essential (primary) hypertension: Secondary | ICD-10-CM

## 2013-12-20 DIAGNOSIS — Z23 Encounter for immunization: Secondary | ICD-10-CM

## 2013-12-20 MED ORDER — PROPRANOLOL HCL 40 MG PO TABS: 40.0000 mg | ORAL_TABLET | Freq: Two times a day (BID) | ORAL | Status: DC

## 2013-12-20 MED ORDER — LISINOPRIL 10 MG PO TABS: 10.0000 mg | ORAL_TABLET | Freq: Every day | ORAL | Status: DC

## 2013-12-20 MED ORDER — BUDESONIDE-FORMOTEROL FUMARATE 80-4.5 MCG/ACT IN AERO: 2.0000 | INHALATION_SPRAY | Freq: Two times a day (BID) | RESPIRATORY_TRACT | Status: DC

## 2013-12-20 NOTE — Progress Notes (Signed)
   Subjective:    Patient ID: Vickie Carter, female    DOB: 08/22/1960, 53 y.o.   MRN: [id number]  Hypertension   Hypertension- Pt denies chest pain, SOB, dizziness, or heart palpitations.  Taking meds as directed w/o problems.  Denies medication side effects.  Still gets occ HA.    Has been more SOB lateley. Has had a mild cough with sputum but has been clear.  No change in color. No fever, chills or sweats. Has been out of her symbicort bc of cost.     Review of Systems     Objective:   Physical Exam  Constitutional: She is oriented to person, place, and time. She appears well-developed and well-nourished.  HENT:  Head: Normocephalic and atraumatic.  Cardiovascular: Normal rate, regular rhythm and normal heart sounds.   Pulmonary/Chest: Effort normal and breath sounds normal.  Neurological: She is alert and oriented to person, place, and time.  Skin: Skin is warm and dry.  Psychiatric: She has a normal mood and affect. Her behavior is normal.          Assessment & Plan:  HTN- Well controlled.  Continue current regimen. Refill sent to the pharmacy. Follow-up in 6 months.   Asthma/Chronic bronchitis-she has been a little bit more short of breath. Been no significant change in sputum volume or color. Will try to get her samples of Symbicort to get back on it. If she's not improving, feels like she is getting worse, develops a fever, or notices a change in sputum and please give us a call back.

## 2014-06-20 ENCOUNTER — Ambulatory Visit: Payer: Self-pay | Admitting: Family Medicine

## 2014-08-21 ENCOUNTER — Other Ambulatory Visit: Payer: Self-pay | Admitting: Family Medicine

## 2014-09-01 ENCOUNTER — Encounter: Payer: Self-pay | Admitting: Family Medicine

## 2014-09-01 ENCOUNTER — Ambulatory Visit (INDEPENDENT_AMBULATORY_CARE_PROVIDER_SITE_OTHER): Payer: BLUE CROSS/BLUE SHIELD | Admitting: Family Medicine

## 2014-09-01 VITALS — BP 101/57 | HR 78 | Ht 64.0 in | Wt 202.0 lb

## 2014-09-01 DIAGNOSIS — I10 Essential (primary) hypertension: Secondary | ICD-10-CM

## 2014-09-01 DIAGNOSIS — J4541 Moderate persistent asthma with (acute) exacerbation: Secondary | ICD-10-CM | POA: Diagnosis not present

## 2014-09-01 DIAGNOSIS — R51 Headache: Secondary | ICD-10-CM

## 2014-09-01 DIAGNOSIS — G8929 Other chronic pain: Secondary | ICD-10-CM

## 2014-09-01 MED ORDER — ALBUTEROL SULFATE (2.5 MG/3ML) 0.083% IN NEBU
2.5000 mg | INHALATION_SOLUTION | Freq: Once | RESPIRATORY_TRACT | Status: AC
  Administered 2014-09-01: 2.5 mg via RESPIRATORY_TRACT

## 2014-09-01 MED ORDER — AZITHROMYCIN 250 MG PO TABS: ORAL_TABLET | ORAL | Status: DC

## 2014-09-01 MED ORDER — PROPRANOLOL HCL 40 MG PO TABS: 40.0000 mg | ORAL_TABLET | Freq: Two times a day (BID) | ORAL | Status: DC

## 2014-09-01 MED ORDER — PREDNISONE 20 MG PO TABS: 40.0000 mg | ORAL_TABLET | Freq: Every day | ORAL | Status: DC

## 2014-09-01 MED ORDER — LISINOPRIL 5 MG PO TABS: 5.0000 mg | ORAL_TABLET | Freq: Every day | ORAL | Status: DC

## 2014-09-01 NOTE — Progress Notes (Signed)
   Subjective:    Patient ID: Vickie Carter, female    DOB: November 23, 1960, 54 y.o.   MRN: [id number]  HPI Hypertension- Pt denies chest pain, SOB, dizziness, or heart palpitations.  Taking meds as directed w/o problems.  Denies medication side effects. No fever, chills or sweats.  Has felt cold. Sputum is clear.   No major sinus sxs.  Says she has ben SOB.    F/U Asthma - feeling bad about 2 months ago with a bad cough.  Using her symbicortBID.  Using her albuterol about every 2 weeks. Peak flow 200, in the red zone today.    Headaches- Takes propranolol BID.  Gets one a week on average.    Note, she hasn't been here in 10 months bc she is without insurance.  Review of Systems     Objective:   Physical Exam  Constitutional: She is oriented to person, place, and time. She appears well-developed and well-nourished.  HENT:  Head: Normocephalic and atraumatic.  Right Ear: External ear normal.  Left Ear: External ear normal.  Nose: Nose normal.  Mouth/Throat: Oropharynx is clear and moist.  TMs and canals are clear.   Eyes: Conjunctivae and EOM are normal. Pupils are equal, round, and reactive to light.  Neck: Neck supple. No thyromegaly present.  Cardiovascular: Normal rate, regular rhythm and normal heart sounds.   Pulmonary/Chest: Effort normal and breath sounds normal. She has no wheezes.  Lymphadenopathy:    She has no cervical adenopathy.  Neurological: She is alert and oriented to person, place, and time.  Skin: Skin is warm and dry.  Psychiatric: She has a normal mood and affect. Her behavior is normal.          Assessment & Plan:  HTN - blood pressures actually little low today and has been the last 3 times that she's been here. decrease lisinopril to 5 mg daily. Fulp in 3-4 months.  Asthma exacerbation - given NEb tx here today as peak flow in the red zone. repeat peak flow post neb tx in the yellow zone.  Will see if have samples for Symbicort.  Will treat with  azithromycin and a five-day course of prednisone. Call if not significantly better in one week.  Chronic HA - under fair control on daily propranolol. Still getting one HA per week.

## 2014-12-02 ENCOUNTER — Ambulatory Visit (INDEPENDENT_AMBULATORY_CARE_PROVIDER_SITE_OTHER): Payer: BLUE CROSS/BLUE SHIELD | Admitting: Family Medicine

## 2014-12-02 ENCOUNTER — Encounter: Payer: Self-pay | Admitting: Family Medicine

## 2014-12-02 VITALS — BP 110/64 | HR 73 | Temp 97.9°F | Resp 18 | Wt 204.2 lb

## 2014-12-02 DIAGNOSIS — Z720 Tobacco use: Secondary | ICD-10-CM

## 2014-12-02 DIAGNOSIS — J4530 Mild persistent asthma, uncomplicated: Secondary | ICD-10-CM

## 2014-12-02 DIAGNOSIS — I10 Essential (primary) hypertension: Secondary | ICD-10-CM | POA: Diagnosis not present

## 2014-12-02 MED ORDER — PROPRANOLOL HCL 20 MG PO TABS: 20.0000 mg | ORAL_TABLET | Freq: Two times a day (BID) | ORAL | Status: DC

## 2014-12-02 MED ORDER — SUCRALFATE 1 G PO TABS: 1.0000 g | ORAL_TABLET | Freq: Four times a day (QID) | ORAL | Status: DC

## 2014-12-02 NOTE — Progress Notes (Signed)
   Subjective:    Patient ID: Lakeena Downie, female    DOB: [birth year], 54 y.o.   MRN: [id number]  HPI Hypertension- Pt denies chest pain, SOB, dizziness, or heart palpitations.  Taking meds as directed w/o problems.  Denies medication side effects.  The lisinopril makes her stomach hurt so she hasn't been taking it. She cannot get lab work because she can't afford the co-pay right now. She has applied for Medicaid.  Asthma - Using her symbicort every day.  She hasn't had to use her albuterol in several weeks.  Occ wheezes after her fist cig in the AM.    Tob abuse - she has been trying to cut back.  Smoking 5-10 cig per day.   Review of Systems     Objective:   Physical Exam  Constitutional: She is oriented to person, place, and time. She appears well-developed and well-nourished.  HENT:  Head: Normocephalic and atraumatic.  Cardiovascular: Normal rate, regular rhythm and normal heart sounds.   Pulmonary/Chest: Effort normal and breath sounds normal.  Neurological: She is alert and oriented to person, place, and time.  Skin: Skin is warm and dry.  Psychiatric: She has a normal mood and affect. Her behavior is normal.        Assessment & Plan:  HTN - well controlled.   Added lisinopril to intolerance list. Low pressure still actually a little bit low today. Will dec propranolol to 20 mg twice a day. Follow-up in 3-4 months..   Asthma - she is doing really well the last couple months on the Symbicort. Will continue current regimen. When I see her back for follow-up consider starting to step down therapy to possibly just an inhaled steroid. Smoking cessation is most important.  Peak flow yellow zone today. Did encourage her to use her albuterol when she got home today.  Tob abuse - she's doing great cutting down. Encouraged her just continue to set goals for herself to wean off of cigarettes. I think this is fantastic.

## 2014-12-25 ENCOUNTER — Other Ambulatory Visit: Payer: Self-pay | Admitting: Family Medicine

## 2015-02-20 ENCOUNTER — Other Ambulatory Visit: Payer: Self-pay | Admitting: Family Medicine

## 2015-03-20 ENCOUNTER — Other Ambulatory Visit: Payer: Self-pay | Admitting: Family Medicine

## 2015-03-20 DIAGNOSIS — Z1231 Encounter for screening mammogram for malignant neoplasm of breast: Secondary | ICD-10-CM

## 2015-04-01 ENCOUNTER — Ambulatory Visit (INDEPENDENT_AMBULATORY_CARE_PROVIDER_SITE_OTHER): Payer: Medicare Other

## 2015-04-01 DIAGNOSIS — R928 Other abnormal and inconclusive findings on diagnostic imaging of breast: Secondary | ICD-10-CM | POA: Diagnosis not present

## 2015-04-01 DIAGNOSIS — Z1231 Encounter for screening mammogram for malignant neoplasm of breast: Secondary | ICD-10-CM | POA: Diagnosis not present

## 2015-04-02 ENCOUNTER — Other Ambulatory Visit: Payer: Self-pay | Admitting: Family Medicine

## 2015-04-02 DIAGNOSIS — R928 Other abnormal and inconclusive findings on diagnostic imaging of breast: Secondary | ICD-10-CM

## 2015-04-06 ENCOUNTER — Other Ambulatory Visit: Payer: Self-pay | Admitting: Family Medicine

## 2015-04-06 ENCOUNTER — Ambulatory Visit (INDEPENDENT_AMBULATORY_CARE_PROVIDER_SITE_OTHER): Payer: Medicare Other | Admitting: Family Medicine

## 2015-04-06 ENCOUNTER — Encounter: Payer: Self-pay | Admitting: Family Medicine

## 2015-04-06 VITALS — BP 121/51 | HR 81 | Ht 64.0 in | Wt 205.0 lb

## 2015-04-06 DIAGNOSIS — Z1159 Encounter for screening for other viral diseases: Secondary | ICD-10-CM

## 2015-04-06 DIAGNOSIS — R928 Other abnormal and inconclusive findings on diagnostic imaging of breast: Secondary | ICD-10-CM

## 2015-04-06 DIAGNOSIS — J4530 Mild persistent asthma, uncomplicated: Secondary | ICD-10-CM

## 2015-04-06 DIAGNOSIS — E785 Hyperlipidemia, unspecified: Secondary | ICD-10-CM

## 2015-04-06 DIAGNOSIS — Z114 Encounter for screening for human immunodeficiency virus [HIV]: Secondary | ICD-10-CM

## 2015-04-06 DIAGNOSIS — I10 Essential (primary) hypertension: Secondary | ICD-10-CM | POA: Diagnosis not present

## 2015-04-06 DIAGNOSIS — Z720 Tobacco use: Secondary | ICD-10-CM

## 2015-04-06 MED ORDER — BECLOMETHASONE DIPROPIONATE 40 MCG/ACT IN AERS: 2.0000 | INHALATION_SPRAY | Freq: Two times a day (BID) | RESPIRATORY_TRACT | Status: DC

## 2015-04-06 NOTE — Patient Instructions (Signed)
Stop the symbicort and use the Qvar instead.   Can use Debrox drops for 4-5 days, twice a day every 2 weeks to help keep your ear wax soft.

## 2015-04-06 NOTE — Progress Notes (Signed)
   Subjective:    Patient ID: Vickie Carter, female    DOB: 04/27/1960, 55 y.o.   MRN: [id number]  HPI Hypertension- Pt denies SOB, dizziness, or heart palpitations.  Taking meds as directed w/o problems.  Denies medication side effects.  I dec her propranolol last time.  Has occ right sided chest pain that is brief and radiates into her arms.   tob abuse - she is down to 1/2 ppd.  She is still trying to wean her cig usage.    Asthma - she is doing well on the Symbicort.  Has been weeks since has used her alubterol.   Cerumen impaction - says her ears are feeling clogged again.  No pain or loss of hearing.   Review of Systems     Objective:   Physical Exam  Constitutional: She is oriented to person, place, and time. She appears well-developed and well-nourished.  HENT:  Head: Normocephalic and atraumatic.  Right Ear: External ear normal.  Left Ear: External ear normal.  Nose: Nose normal.  Mouth/Throat: Oropharynx is clear and moist.  Right canal blocked by cerumen.  Left canal partially blocked by cerumen.   Eyes: Conjunctivae and EOM are normal. Pupils are equal, round, and reactive to light.  Neck: Neck supple. No thyromegaly present.  Cardiovascular: Normal rate, regular rhythm and normal heart sounds.   Pulmonary/Chest: Effort normal and breath sounds normal. She has no wheezes.  Lymphadenopathy:    She has no cervical adenopathy.  Neurological: She is alert and oriented to person, place, and time.  Skin: Skin is warm and dry.  Psychiatric: She has a normal mood and affect.          Assessment & Plan:  HTN - well controlled. Continue current regimen.  Tobacco abuse-continue to work on weaning. Next  Asthma, mild persistent-decreased Symbicort to just inhaled corticosteroid. We'll switch to Qvar. Follow-up in 3-4 months. Encouraged smoking cessation.  Cerumen impaction-both ears irrigated. Patient tolerated well.  Hyper lipidemia-due to recheck lipids.  Due  for hepatitis C screening as well as HIV screening.

## 2015-04-07 LAB — COMPLETE METABOLIC PANEL WITH GFR
ALBUMIN: 4.2 g/dL (ref 3.6–5.1)
ALK PHOS: 92 U/L (ref 33–130)
ALT: 21 U/L (ref 6–29)
AST: 21 U/L (ref 10–35)
BUN: 18 mg/dL (ref 7–25)
CO2: 28 mmol/L (ref 20–31)
Calcium: 9.9 mg/dL (ref 8.6–10.4)
Chloride: 99 mmol/L (ref 98–110)
Creat: 1.01 mg/dL (ref 0.50–1.05)
GFR, EST NON AFRICAN AMERICAN: 62 mL/min (ref >=60)
GFR, Est African American: 71 mL/min (ref >=60)
GLUCOSE: 92 mg/dL (ref 65–99)
POTASSIUM: 5.2 mmol/L (ref 3.5–5.3)
SODIUM: 142 mmol/L (ref 135–146)
Total Bilirubin: 0.3 mg/dL (ref 0.2–1.2)
Total Protein: 7.4 g/dL (ref 6.1–8.1)

## 2015-04-07 LAB — HIV ANTIBODY (ROUTINE TESTING W REFLEX): HIV: NONREACTIVE

## 2015-04-07 LAB — LIPID PANEL
CHOL/HDL RATIO: 7.7 ratio — AB (ref <=5.0)
Cholesterol: 353 mg/dL — ABNORMAL HIGH (ref 125–200)
HDL: 46 mg/dL (ref >=46)
Triglycerides: 468 mg/dL — ABNORMAL HIGH (ref <150)

## 2015-04-07 LAB — HEPATITIS C ANTIBODY: HCV AB: NEGATIVE

## 2015-04-09 ENCOUNTER — Telehealth: Payer: Self-pay

## 2015-04-09 LAB — LDL CHOLESTEROL, DIRECT: LDL DIRECT: 224 mg/dL — AB (ref <130)

## 2015-04-09 MED ORDER — PITAVASTATIN CALCIUM 1 MG PO TABS: 1.0000 mg | ORAL_TABLET | Freq: Every day | ORAL | Status: DC

## 2015-04-09 NOTE — Addendum Note (Signed)
Addended by: Nani Gasser D on: 04/09/2015 05:42 PM   Modules accepted: Orders

## 2015-04-10 ENCOUNTER — Ambulatory Visit
Admission: RE | Admit: 2015-04-10 | Discharge: 2015-04-10 | Disposition: A | Discharge status: Home / Self Care | Payer: Medicare Other | Source: Ambulatory Visit | Attending: Family Medicine | Admitting: Family Medicine

## 2015-04-10 DIAGNOSIS — R928 Other abnormal and inconclusive findings on diagnostic imaging of breast: Secondary | ICD-10-CM

## 2015-04-15 ENCOUNTER — Other Ambulatory Visit: Payer: Self-pay | Admitting: Family Medicine

## 2015-04-15 MED ORDER — FLUTICASONE PROPIONATE HFA 220 MCG/ACT IN AERO: 2.0000 | INHALATION_SPRAY | Freq: Two times a day (BID) | RESPIRATORY_TRACT | Status: AC

## 2015-04-15 NOTE — Progress Notes (Signed)
Got note from her insurance. teh Qvar is not covered.  Will send new rx for flovent instead to [facility].

## 2015-04-28 ENCOUNTER — Telehealth: Payer: Self-pay | Admitting: *Deleted

## 2015-04-28 NOTE — Telephone Encounter (Signed)
Received notification from [facility] that patient needs a PA for Livalo. Called [employer] [facility] and patient's account termed 03-25-2015. Card scanned in [employer] called number on the back of card patient does have an active plan with [employer]. Initiated through

## 2015-05-04 ENCOUNTER — Other Ambulatory Visit: Payer: Self-pay | Admitting: Family Medicine

## 2015-05-08 NOTE — Telephone Encounter (Signed)
Your request has been approved  Effective from 04/28/2015 through 12/31/  Left message on pt and pharm vm

## 2015-07-20 ENCOUNTER — Other Ambulatory Visit: Payer: Self-pay | Admitting: Family Medicine

## 2015-10-08 ENCOUNTER — Other Ambulatory Visit: Payer: Self-pay | Admitting: *Deleted

## 2015-10-08 MED ORDER — PROPRANOLOL HCL 20 MG PO TABS: ORAL_TABLET | ORAL | 4 refills | Status: DC

## 2015-10-08 MED ORDER — PITAVASTATIN CALCIUM 1 MG PO TABS: 1.0000 mg | ORAL_TABLET | Freq: Every day | ORAL | 11 refills | Status: DC

## 2015-10-15 ENCOUNTER — Telehealth: Payer: Self-pay | Admitting: *Deleted

## 2015-10-15 NOTE — Telephone Encounter (Signed)
Submitted PA through covermymeds, [id number]

## 2015-10-15 NOTE — Telephone Encounter (Signed)
Informed pharm that PA was completed back in march and approved through 01/2038. Rejection notice from the pharm states medication is no longer on the formulary

## 2015-10-22 ENCOUNTER — Ambulatory Visit: Payer: Medicare Other | Admitting: Family Medicine

## 2015-12-02 NOTE — Telephone Encounter (Signed)
Approvedon August 24  Effective from 10/15/2015 through 10/14/2016.

## 2016-01-08 ENCOUNTER — Encounter: Payer: Self-pay | Admitting: Family Medicine

## 2016-01-08 ENCOUNTER — Ambulatory Visit (INDEPENDENT_AMBULATORY_CARE_PROVIDER_SITE_OTHER): Payer: Medicare Other | Admitting: Family Medicine

## 2016-01-08 ENCOUNTER — Ambulatory Visit (INDEPENDENT_AMBULATORY_CARE_PROVIDER_SITE_OTHER): Payer: Medicare Other

## 2016-01-08 VITALS — BP 125/65 | HR 70 | Wt 201.0 lb

## 2016-01-08 DIAGNOSIS — R059 Cough, unspecified: Secondary | ICD-10-CM

## 2016-01-08 DIAGNOSIS — R05 Cough: Secondary | ICD-10-CM | POA: Diagnosis not present

## 2016-01-08 DIAGNOSIS — K0889 Other specified disorders of teeth and supporting structures: Secondary | ICD-10-CM | POA: Diagnosis not present

## 2016-01-08 MED ORDER — HYDROCODONE-ACETAMINOPHEN 5-325 MG PO TABS: 1.0000 | ORAL_TABLET | Freq: Four times a day (QID) | ORAL | 0 refills | Status: DC | PRN

## 2016-01-08 MED ORDER — CLINDAMYCIN HCL 300 MG PO CAPS: 300.0000 mg | ORAL_CAPSULE | Freq: Four times a day (QID) | ORAL | 0 refills | Status: DC

## 2016-01-08 NOTE — Patient Instructions (Addendum)
Please call make an appointment with her dentist as soon as possible. Please go to the emergency department if you develop a fever or if pain is uncontrolled.

## 2016-01-08 NOTE — Progress Notes (Signed)
Subjective:    CC: Asthma   HPI:   Patient comes in today complaining of right upper molar pain. It's been bothering her for several weeks. The pain got so bad last night that she was unable to sleep and almost went to the emergency department. She was given a mouth rinse. She's taken several different over-the-counter medications. The only thing that helped even little was prescription diclofenac that someone else had given her. She's not having any fevers or chills.  He also complains of cough for the cough x 1 year. She has a history of asthma.  Past medical history, Surgical history, Family history not pertinant except as noted below, Social history, Allergies, and medications have been entered into the medical record, reviewed, and corrections made.   Review of Systems: No fevers, chills, night sweats, weight loss, chest pain, or shortness of breath.   Objective:    General: Well Developed, well nourished, and in no acute distress.  Neuro: Alert and oriented x3, extra-ocular muscles intact, sensation grossly intact.  HEENT: Normocephalic, atraumatic, Or pharynx is clear. She does have a lot of tenderness around the second molar on the right upper jaw. No active drainage or erythema. Just tender to touch. No significant cervical lymphadenopathy. Skin: Warm and dry, no rashes. Cardiac: Regular rate and rhythm, no murmurs rubs or gallops, no lower extremity edema.  Respiratory: Clear to auscultation bilaterally. Not using accessory muscles, speaking in full sentences.   Impression and Recommendations:   Dental pain - may go ahead and put her on erythromycin and give her some medication short-term for pain. But I explained that she absolutely has to get in with a dentist to get this taken care of. That what I'm giving her is not going to correct the underlying condition. She has to get the tooth treated or even removed put. Okay to continue to use the diclofenac as well.  Cough-we'll get  chest x-ray for further evaluation. Consider scheduling spirometry soon as well.

## 2016-03-24 ENCOUNTER — Telehealth: Payer: Self-pay | Admitting: *Deleted

## 2016-03-24 ENCOUNTER — Telehealth: Payer: Self-pay

## 2016-03-24 NOTE — Telephone Encounter (Signed)
Pt lvm stating that the Livalo is to expensive it will cost her $103 out of pocket. She wanted to know if there is alternative for her to take.  Will fwd to pcp for advice.Loralee PacasBarkley, Izora Benn Cres[place]Lynetta

## 2016-03-24 NOTE — Telephone Encounter (Signed)
Pt called and stated her cholesterol medication Pitavastatin calcium is very expensive and wants to change to something that is not as expensive. Please advise?

## 2016-03-25 NOTE — Telephone Encounter (Signed)
Call patient: Crestor is now generic and I do not believe that she has tried that one. She may do very well with that. If she is okay with it and send over Crestor 20 mg daily at bedtime #90 with 3 refills.  Nani Gasseratherine Metheney, MD

## 2016-03-29 MED ORDER — ROSUVASTATIN CALCIUM 10 MG PO TABS: 10.0000 mg | ORAL_TABLET | Freq: Every day | ORAL | 3 refills | Status: DC

## 2016-03-29 NOTE — Telephone Encounter (Signed)
Pt states she is ok with taking a generic for Crestor. Pt wants to know if it is ok for her to take in evening?

## 2016-03-29 NOTE — Telephone Encounter (Signed)
Called pt and lvm informing her of recommendation. Advised to return call if ok with this.Loralee PacasBarkley, Lettie Czarnecki [place]

## 2016-03-29 NOTE — Telephone Encounter (Signed)
New perception sent to pharmacy. Yes she can take it at bedtime just like she's been doing with a Livalo.    Nani Gasseratherine Grayce Budden, MD

## 2016-03-29 NOTE — Telephone Encounter (Signed)
We can try Crestor which has a generic. See if she is ok with this.   Vickie Gasseratherine Cathlene Gardella, MD

## 2016-03-30 NOTE — Telephone Encounter (Signed)
Pt informed

## 2016-03-30 NOTE — Telephone Encounter (Signed)
Pt informed of results. Pt expressed understanding and is agreeable. Farren Landa CMA, RT 

## 2016-04-06 ENCOUNTER — Telehealth: Payer: Self-pay

## 2016-04-06 NOTE — Telephone Encounter (Signed)
[facility]

## 2016-04-06 NOTE — Telephone Encounter (Signed)
Once we have sent into prescriptions that were costly on her insurance plan I would recommend at this point that she actually calls her insurance plan herself and finds out which statins are actually covered on her plan  Nani Gasseratherine Krysia Zahradnik, MD

## 2016-04-06 NOTE — Telephone Encounter (Signed)
Vickie Carter would like a cheaper medication. The Crestor cost to much. Please advise.

## 2016-04-08 NOTE — Telephone Encounter (Signed)
Called pt and she reports that she tried the crestor and it caused heartburn and nausea. Will add to allergy list.Starr Engel, Viann Shoveonya [name]

## 2016-05-16 ENCOUNTER — Other Ambulatory Visit: Payer: Self-pay | Admitting: Family Medicine

## 2016-06-08 ENCOUNTER — Telehealth: Payer: Self-pay | Admitting: Family Medicine

## 2016-06-08 NOTE — Telephone Encounter (Signed)
Left pt a message to call our office and schedule a f/u appt with Dr. Linford Arnold for her BP

## 2016-06-13 ENCOUNTER — Ambulatory Visit (INDEPENDENT_AMBULATORY_CARE_PROVIDER_SITE_OTHER): Payer: Medicare Other | Admitting: Family Medicine

## 2016-06-13 ENCOUNTER — Encounter: Payer: Self-pay | Admitting: Family Medicine

## 2016-06-13 VITALS — BP 122/54 | HR 67 | Ht 64.0 in | Wt 207.0 lb

## 2016-06-13 DIAGNOSIS — I10 Essential (primary) hypertension: Secondary | ICD-10-CM

## 2016-06-13 DIAGNOSIS — R062 Wheezing: Secondary | ICD-10-CM | POA: Diagnosis not present

## 2016-06-13 DIAGNOSIS — E782 Mixed hyperlipidemia: Secondary | ICD-10-CM | POA: Diagnosis not present

## 2016-06-13 DIAGNOSIS — J4541 Moderate persistent asthma with (acute) exacerbation: Secondary | ICD-10-CM

## 2016-06-13 MED ORDER — ALBUTEROL SULFATE (2.5 MG/3ML) 0.083% IN NEBU
2.5000 mg | INHALATION_SOLUTION | Freq: Once | RESPIRATORY_TRACT | Status: AC
  Administered 2016-06-13: 2.5 mg via RESPIRATORY_TRACT

## 2016-06-13 MED ORDER — ALBUTEROL SULFATE HFA 108 (90 BASE) MCG/ACT IN AERS: 2.0000 | INHALATION_SPRAY | RESPIRATORY_TRACT | 2 refills | Status: AC | PRN

## 2016-06-13 MED ORDER — DOXYCYCLINE HYCLATE 100 MG PO TABS: 100.0000 mg | ORAL_TABLET | Freq: Two times a day (BID) | ORAL | 0 refills | Status: DC

## 2016-06-13 MED ORDER — PREDNISONE 20 MG PO TABS: 40.0000 mg | ORAL_TABLET | Freq: Every day | ORAL | 0 refills | Status: DC

## 2016-06-13 NOTE — Progress Notes (Signed)
Subjective:    CC: HTN HPI:  Hypertension- Pt denies chest pain, SOB, dizziness, or heart palpitations.  Taking meds as directed w/o problems.  Denies medication side effects.    Asthma  - she has been wheezing for about 2 months. Using an old inhaler. She is not sure whic one. She has had an increase in cough and shortness of breath.  Hyperlipidemia - unfortunate she has tried multiple statins in the only one that she's been able to tolerate is little low but she is unable to afford it currently because of the cost.  Past medical history, Surgical history, Family history not pertinant except as noted below, Social history, Allergies, and medications have been entered into the medical record, reviewed, and corrections made.   Review of Systems: No fevers, chills, night sweats, weight loss, chest pain, or shortness of breath.   Objective:    General: Well Developed, well nourished, and in no acute distress.  Neuro: Alert and oriented x3, extra-ocular muscles intact, sensation grossly intact.  HEENT: Normocephalic, atraumatic  Skin: Warm and dry, no rashes. Cardiac: Regular rate and rhythm, no murmurs rubs or gallops, no lower extremity edema.  Respiratory: Clear to auscultation bilaterally. Not using accessory muscles, speaking in full sentences.   Impression and Recommendations:   HTN - Well controlled. Continue current regimen. Follow up in  6 months.   Asthma exacerbation-we'll treat with doxycycline and 5 days of prednisone. She is also a same day smoker. Her peak flow was in the yellow zone. It did come up 20 L/m after albuterol treatment. Make sure she has albuterol inhalers all similar to the pharmacy as well. I like to have her come back in about a month for spirometry. If not better in one week consider CXR.    Hyperlipidemia-unfortunately the patent for Livalo will likely not go generic until 2020. He could certainly see if we have any coupon cards that may make it more  reasonable.

## 2016-07-27 ENCOUNTER — Other Ambulatory Visit: Payer: Self-pay | Admitting: Family Medicine

## 2016-08-26 ENCOUNTER — Ambulatory Visit (INDEPENDENT_AMBULATORY_CARE_PROVIDER_SITE_OTHER): Payer: Medicare Other | Admitting: Family Medicine

## 2016-08-26 ENCOUNTER — Encounter: Payer: Self-pay | Admitting: Family Medicine

## 2016-08-26 VITALS — BP 127/87 | HR 80 | Resp 16 | Ht 64.0 in | Wt 209.6 lb

## 2016-08-26 DIAGNOSIS — Z1231 Encounter for screening mammogram for malignant neoplasm of breast: Secondary | ICD-10-CM | POA: Diagnosis not present

## 2016-08-26 DIAGNOSIS — N3944 Nocturnal enuresis: Secondary | ICD-10-CM | POA: Diagnosis not present

## 2016-08-26 DIAGNOSIS — Z0000 Encounter for general adult medical examination without abnormal findings: Secondary | ICD-10-CM | POA: Diagnosis not present

## 2016-08-26 MED ORDER — OXYBUTYNIN CHLORIDE 5 MG PO TABS: 5.0000 mg | ORAL_TABLET | Freq: Two times a day (BID) | ORAL | 0 refills | Status: AC

## 2016-08-26 NOTE — Progress Notes (Addendum)
Subjective:   Vickie Carter is a 56 y.o. female who presents for Medicare Annual (Subsequent) preventive examination.  She did want to discuss that she has been bedwetting almost every night for several years. Since she had a previous drug overdose for suicide attempt. This is been going on. Once in a great while she'll have a dry night if she doesn't take a lot of fluid. No dysuria or hematuria.  Review of Systems:  Comprehensive ROS is negative.         Objective:     Vitals: BP 127/87   Pulse 80   Resp 16   Ht 5\' 4"  (1.626 m)   Wt 209 lb 9.6 oz (95.1 kg)   BMI 35.98 kg/m   Body mass index is 35.98 kg/m.  Physical Exam  Constitutional: She is oriented to person, place, and time. She appears well-developed and well-nourished.  HENT:  Head: Normocephalic and atraumatic.  Right Ear: External ear normal.  Left Ear: External ear normal.  Nose: Nose normal.  Mouth/Throat: Oropharynx is clear and moist.  TMs and canals are clear.   Eyes: Conjunctivae and EOM are normal. Pupils are equal, round, and reactive to light.  Neck: Neck supple. No thyromegaly present.  Cardiovascular: Normal rate, regular rhythm and normal heart sounds.   Pulmonary/Chest: Effort normal and breath sounds normal. She has no wheezes.  Lymphadenopathy:    She has no cervical adenopathy.  Neurological: She is alert and oriented to person, place, and time.  Skin: Skin is warm and dry.  Psychiatric: She has a normal mood and affect. Her behavior is normal.     Tobacco History  Smoking Status  . Current Some Day Smoker  . Packs/day: 0.13  . Years: 45.00  . Types: Cigarettes  . Last attempt to quit: 02/22/2012  Smokeless Tobacco  . Never Used     Ready to quit: Not Answered Counseling given: Not Answered   Past Medical History:  Diagnosis Date  . Asthma   . History of drug overdose    trazadone  . Migraines   . Reflux    Past Surgical History:  Procedure Laterality Date  . BACK  SURGERY  2003   Family History  Problem Relation Age of Onset  . Depression Sister   . Alcohol abuse Brother    History  Sexual Activity  . Sexual activity: Not on file    Outpatient Encounter Prescriptions as of 08/26/2016  Medication Sig  . albuterol (VENTOLIN HFA) 108 (90 Base) MCG/ACT inhaler Inhale 2 puffs into the lungs every 4 (four) hours as needed for wheezing or shortness of breath.  . ALPRAZolam (XANAX) 1 MG tablet   . diclofenac (VOLTAREN) 50 MG EC tablet   . FLUoxetine (PROZAC) 20 MG capsule Take 40 mg by mouth daily. Take 2 capsules by mouth daily  . fluticasone (FLOVENT HFA) 220 MCG/ACT inhaler Inhale 2 puffs into the lungs 2 (two) times daily.  . Melatonin 3 MG CAPS Take 6 mg by mouth at bedtime.  . propranolol (INDERAL) 40 MG tablet Take 1 tablet (40 mg total) by mouth 2 (two) times daily.  . traZODone (DESYREL) 50 MG tablet Take 100 mg by mouth at bedtime. For sleep  . oxybutynin (DITROPAN) 5 MG tablet Take 1 tablet (5 mg total) by mouth 2 (two) times daily.  . [DISCONTINUED] doxycycline (VIBRA-TABS) 100 MG tablet Take 1 tablet (100 mg total) by mouth 2 (two) times daily.  . [DISCONTINUED] levETIRAcetam (KEPPRA) 500 MG tablet  take 2 tablet two times a day (to be taken over by neurologist once patient gets disability)  . [DISCONTINUED] predniSONE (DELTASONE) 20 MG tablet Take 2 tablets (40 mg total) by mouth daily.   No facility-administered encounter medications on file as of 08/26/2016.     Activities of Daily Living In your present state of health, do you have any difficulty performing the following activities: 08/26/2016  Hearing? Y  Vision? N  Difficulty concentrating or making decisions? N  Walking or climbing stairs? N  Dressing or bathing? N  Doing errands, shopping? N  Some recent data might be hidden    Patient Care Team: Agapito Games, MD as PCP - General    Assessment:    Medicare wellness   Exercise Activities and Dietary  recommendations Current Exercise Habits: Home exercise routine, Type of exercise: walking, Time (Minutes): 20, Frequency (Times/Week): 3, Weekly Exercise (Minutes/Week): 60, Intensity: Mild  Goals    None     Fall Risk No flowsheet data found. Depression Screen No flowsheet data found.   Cognitive Function     6CIT Screen 08/26/2016  What Year? 0 points  What month? 0 points  What time? 0 points  Count back from 20 2 points  Months in reverse 4 points  Repeat phrase 4 points  Total Score 10    Immunization History  Administered Date(s) Administered  . Influenza Split 10/23/2011  . Influenza Whole 11/06/2009, 10/22/2010  . Influenza,inj,Quad PF,36+ Mos 10/18/2013  . Influenza-Unspecified 02/22/2015  . Tdap 12/20/2013   Screening Tests Health Maintenance  Topic Date Due  . INFLUENZA VACCINE  05/22/2018 (Originally 09/21/2016)  . PAP SMEAR  06/13/2021 (Originally 03/07/1981)  . MAMMOGRAM  03/31/2017  . COLONOSCOPY  10/16/2019  . TETANUS/TDAP  12/21/2023  . Hepatitis C Screening  Completed  . HIV Screening  Completed      Plan:     I have personally reviewed and noted the following in the patient's chart:   . Medical and social history . Use of alcohol, tobacco or illicit drugs  . Current medications and supplements . Functional ability and status . Nutritional status . Physical activity . Advanced directives . List of other physicians . Hospitalizations, surgeries, and ER visits in previous 12 months . Vitals Screenings to include cognitive, depression, and falls - she is not interested in hearing screening at this time.  -She also screen significantly high for her depression symptoms today. She sees Dr. Gaynell Face. I encouraged her to try to call next week and get in and set of waiting until her regular follow-up appointment. She agreed to call on Monday. - Declines pap smear again today. Strongly encourage her to think about is.   - Bedwetting-we'll try  medication for overactive bladder for 1 month and see if this is helpful. We also discussed cutting off fluid intake about 2 hours before bedtime as well. 6 CIT score elevated:    In addition, I have reviewed and discussed with patient certain preventive protocols, quality metrics, and best practice recommendations. A written personalized care plan for preventive services as well as general preventive health recommendations were provided to patient.     Alizee Maple, MD  08/26/2016

## 2016-08-26 NOTE — Patient Instructions (Signed)
Keep up a regular exercise program and make sure you are eating a healthy diet Try to eat 4 servings of dairy a day, or if you are lactose intolerant take a calcium with vitamin D daily.  Your vaccines are up to date.   Call Dr. Gaynell FaceMarshall and try to get in next week if at all possible. We are to try medicine for your over active bladder and I'll see you back in a month to see if it's working well. Please consider getting her Pap smear. Order placed for your mammogram. That should hopefully call you to get she scheduled this summer.

## 2016-09-12 ENCOUNTER — Ambulatory Visit (INDEPENDENT_AMBULATORY_CARE_PROVIDER_SITE_OTHER): Payer: Medicare Other | Admitting: Family Medicine

## 2016-09-12 ENCOUNTER — Encounter: Payer: Self-pay | Admitting: Family Medicine

## 2016-09-12 VITALS — BP 122/42 | HR 77 | Wt 207.0 lb

## 2016-09-12 DIAGNOSIS — Z720 Tobacco use: Secondary | ICD-10-CM | POA: Diagnosis not present

## 2016-09-12 DIAGNOSIS — J4521 Mild intermittent asthma with (acute) exacerbation: Secondary | ICD-10-CM

## 2016-09-12 DIAGNOSIS — I10 Essential (primary) hypertension: Secondary | ICD-10-CM

## 2016-09-12 NOTE — Progress Notes (Signed)
Subjective:    CC: HTN, Asthma   HPI: Hypertension- Pt denies chest pain, SOB, dizziness, or heart palpitations.  Taking meds as directed w/o problems.  Denies medication side effects.    Asthma - uses her albuterol maybe once a week. She is doing well overall. She's really tried hard to cut back on her smoking. She says some days she doesn't smoke at all but hasn't completely quit.  He was also recently started on ADD medication by her psychiatrist. She says it's actually really helped her mood.  She says it has given her energy and she actually feels much better.  Past medical history, Surgical history, Family history not pertinant except as noted below, Social history, Allergies, and medications have been entered into the medical record, reviewed, and corrections made.   Review of Systems: No fevers, chills, night sweats, weight loss, chest pain, or shortness of breath.   Objective:    General: Well Developed, well nourished, and in no acute distress.  Neuro: Alert and oriented x3, extra-ocular muscles intact, sensation grossly intact.  HEENT: Normocephalic, atraumatic  Skin: Warm and dry, no rashes. Cardiac: Regular rate and rhythm, no murmurs rubs or gallops, no lower extremity edema.  Respiratory: Clear to auscultation bilaterally. Not using accessory muscles, speaking in full sentences.   Impression and Recommendations:    HTN - Well controlled but low today.  Cut propranolol in half and take half a tab BID.  Follow up in  6 months. Due for CMP and lipid.  Asthma - Continue to work on smoking cessation. Mild intermittent. Continue use albuterol as needed. If using more frequently than please let me know.  Tobacco abuse-has decreased her smoking.

## 2016-09-12 NOTE — Patient Instructions (Signed)
propranol - cut in half.  Take 1/2 tab twice a day.

## 2016-09-21 DEATH — deceased

## 2016-09-26 ENCOUNTER — Ambulatory Visit: Payer: Medicare Other

## 2017-03-15 IMAGING — DX DG CHEST 2V
2 series · 2 of 2 positions shown · non-contrast
Comparison: 09/09/2011

CLINICAL DATA: Cough

EXAM:
CHEST  2 VIEW

[chest pa]
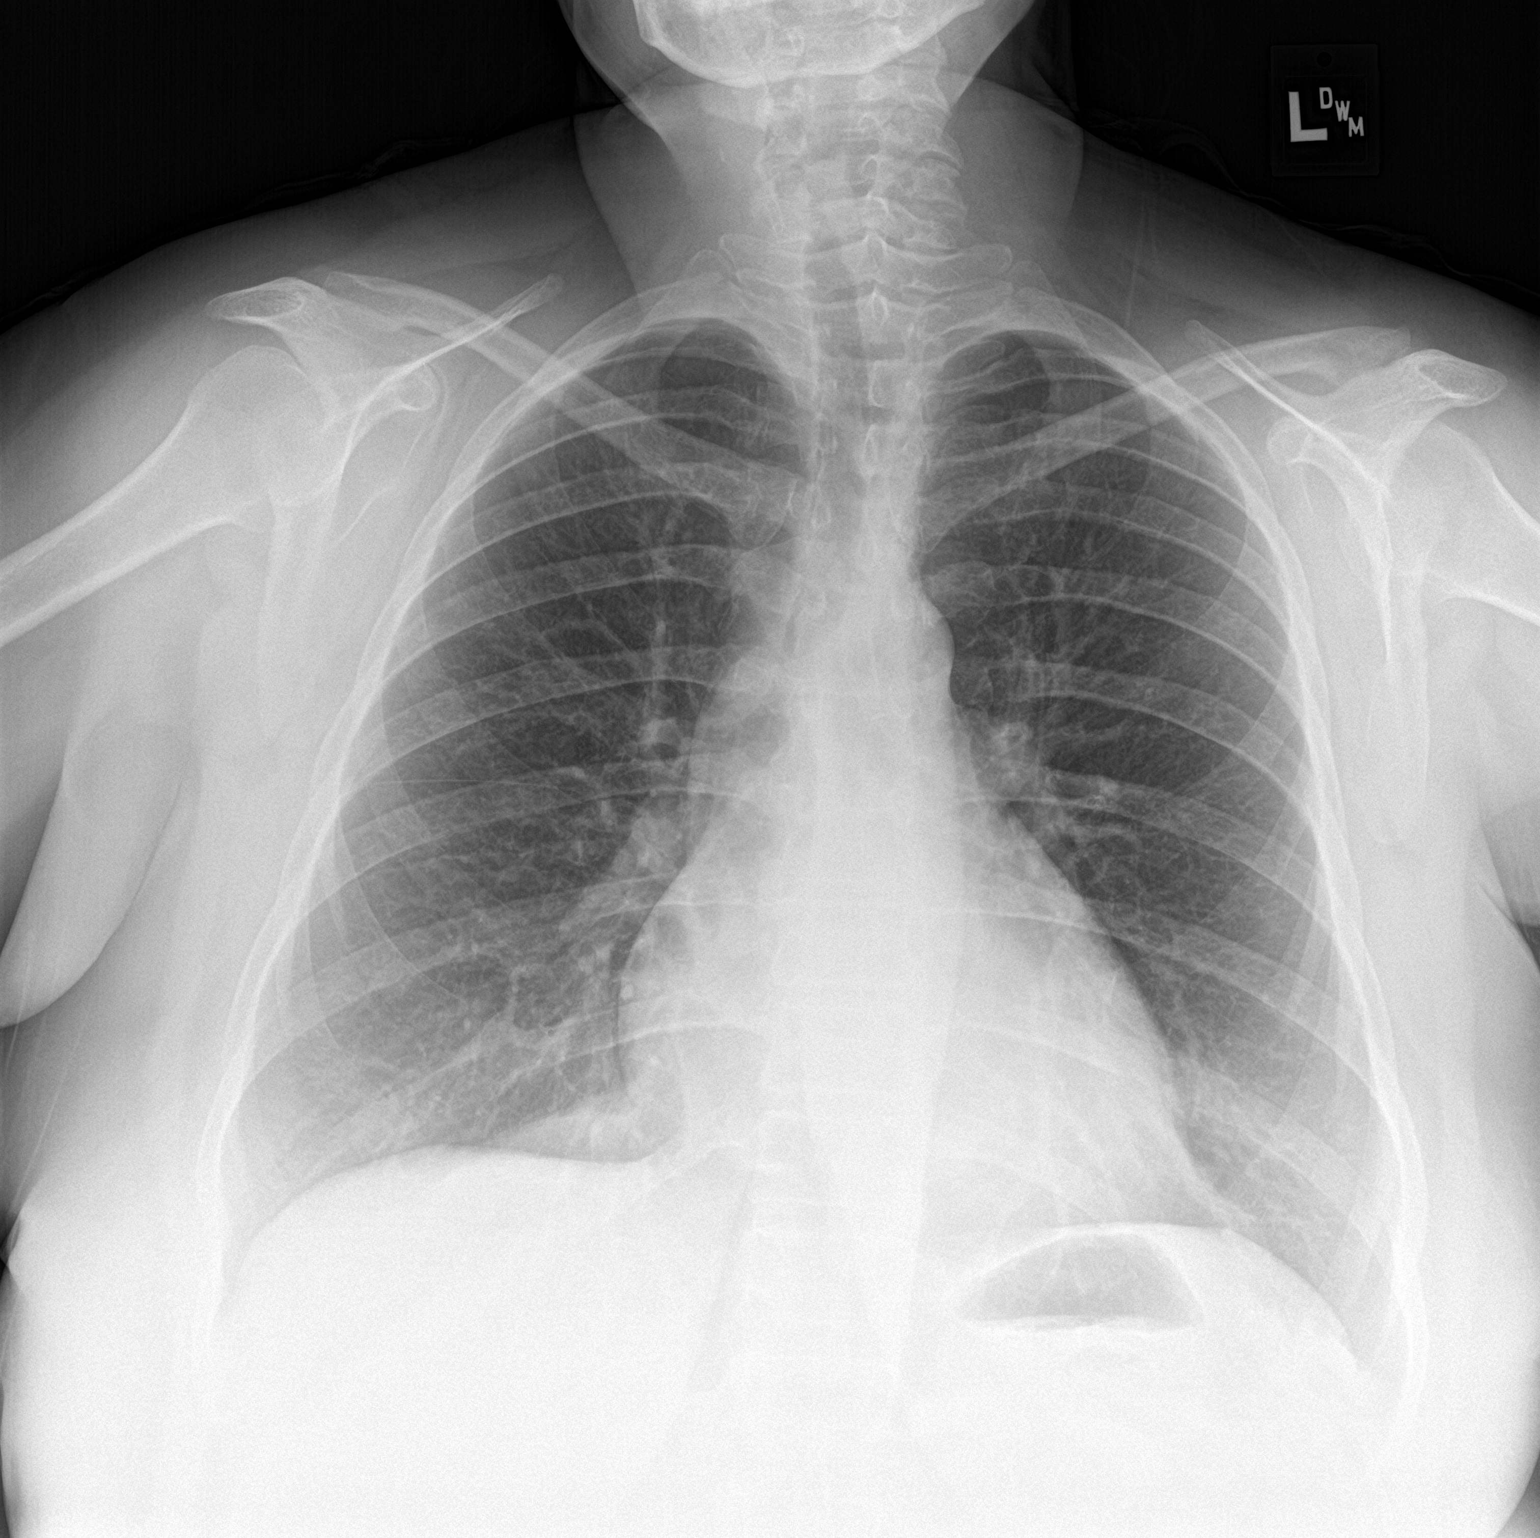

[chest lat]
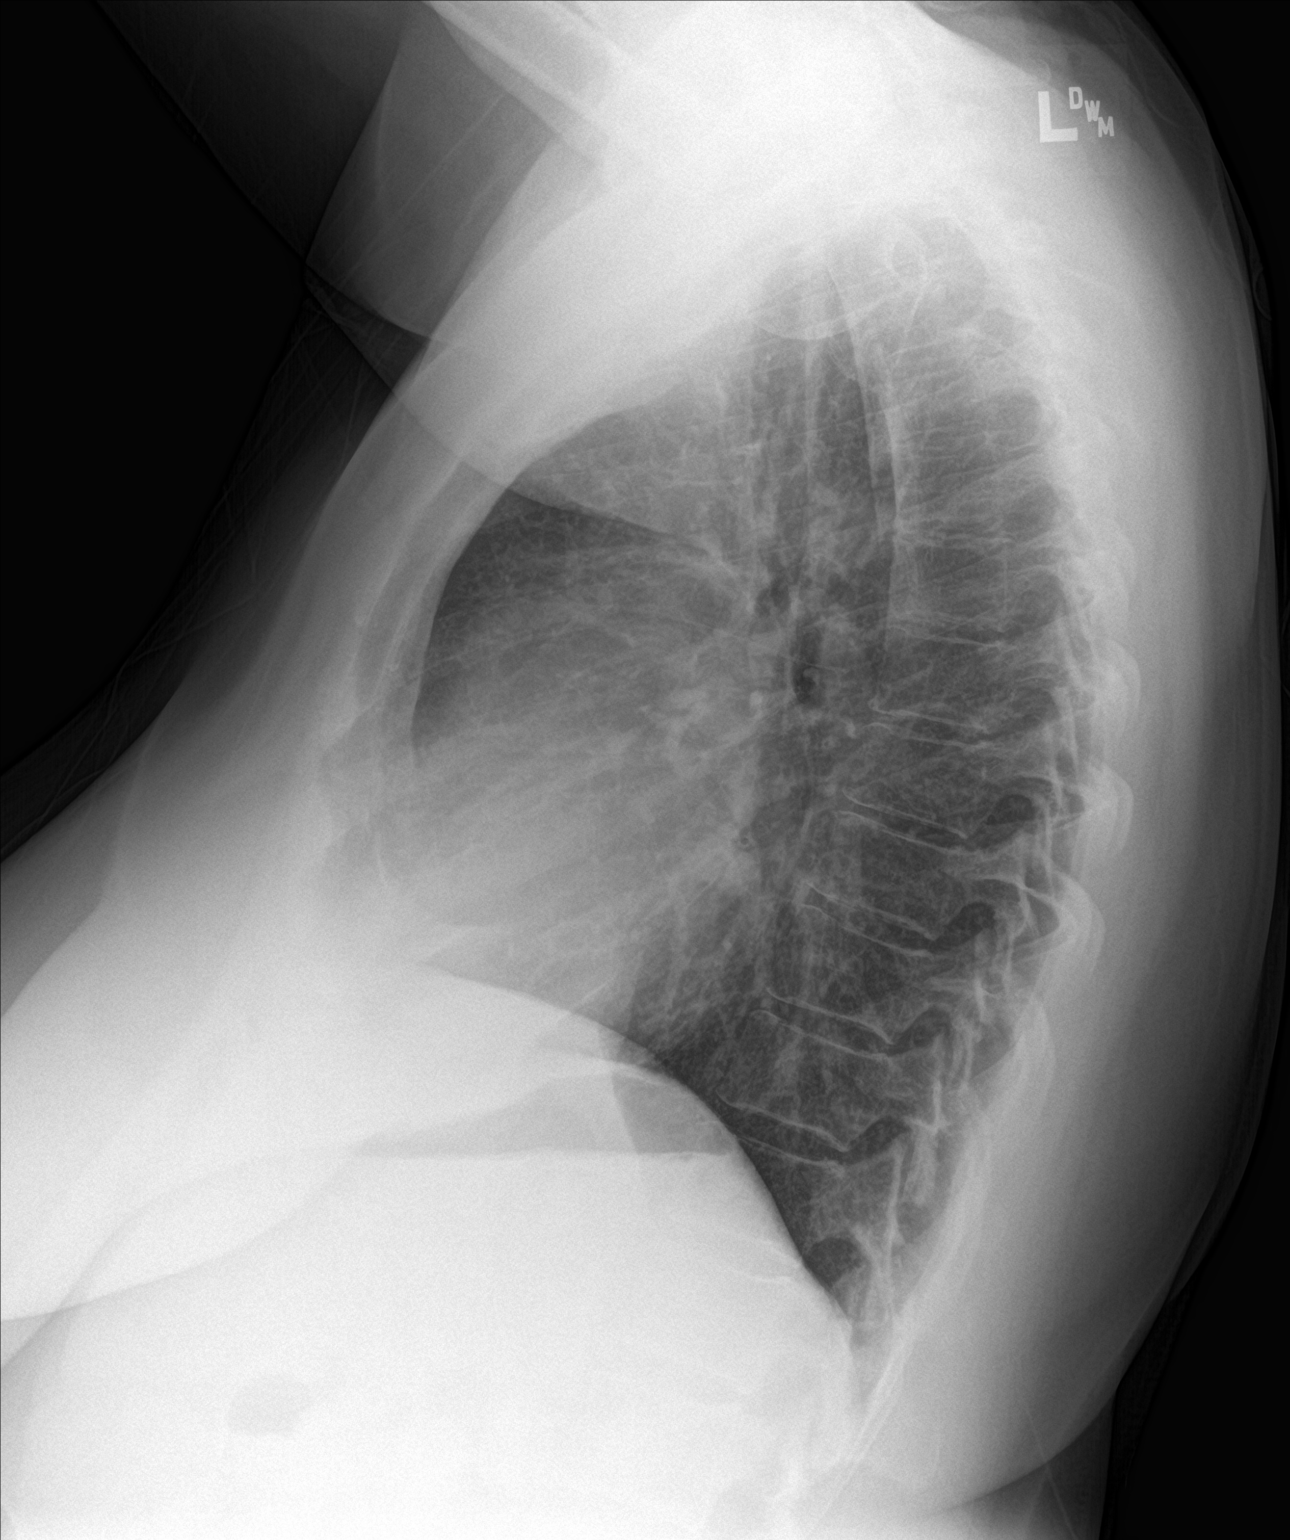

[2 of 2 positions shown; findings below may reference images not displayed]

FINDINGS: The heart size and mediastinal contours are within normal limits.
Both lungs are clear. The visualized skeletal structures are
unremarkable.
IMPRESSION: No active cardiopulmonary disease.
